# Patient Record
Sex: Female | Born: 1975 | Race: White | Hispanic: No | Smoking: Former smoker
Health system: Southern US, Community
[De-identification: ages and names within clinical notes are randomized; demographics above are authoritative.]

## PROBLEM LIST (undated history)

## (undated) DIAGNOSIS — I10 Essential (primary) hypertension: Secondary | ICD-10-CM

## (undated) DIAGNOSIS — Z789 Other specified health status: Secondary | ICD-10-CM

## (undated) DIAGNOSIS — F419 Anxiety disorder, unspecified: Secondary | ICD-10-CM

## (undated) DIAGNOSIS — G4700 Insomnia, unspecified: Secondary | ICD-10-CM

## (undated) DIAGNOSIS — K529 Noninfective gastroenteritis and colitis, unspecified: Secondary | ICD-10-CM

## (undated) DIAGNOSIS — D649 Anemia, unspecified: Secondary | ICD-10-CM

## (undated) DIAGNOSIS — M1990 Unspecified osteoarthritis, unspecified site: Secondary | ICD-10-CM

## (undated) DIAGNOSIS — K219 Gastro-esophageal reflux disease without esophagitis: Secondary | ICD-10-CM

## (undated) HISTORY — PX: TUBAL LIGATION: SHX77

## (undated) HISTORY — DX: Insomnia, unspecified: G47.00

## (undated) HISTORY — PX: ABDOMINAL SURGERY: SHX537

## (undated) HISTORY — DX: Noninfective gastroenteritis and colitis, unspecified: K52.9

## (undated) HISTORY — DX: Unspecified osteoarthritis, unspecified site: M19.90

## (undated) HISTORY — DX: Anemia, unspecified: D64.9

## (undated) HISTORY — DX: Gastro-esophageal reflux disease without esophagitis: K21.9

---

## 1999-10-01 ENCOUNTER — Emergency Department (HOSPITAL_COMMUNITY): Admission: EM | Admit: 1999-10-01 | Discharge: 1999-10-01 | Payer: Self-pay | Admitting: Emergency Medicine

## 2000-02-18 ENCOUNTER — Inpatient Hospital Stay (HOSPITAL_COMMUNITY): Admission: AD | Admit: 2000-02-18 | Discharge: 2000-02-18 | Payer: Self-pay | Admitting: Obstetrics

## 2000-08-17 ENCOUNTER — Inpatient Hospital Stay (HOSPITAL_COMMUNITY): Admission: AD | Admit: 2000-08-17 | Discharge: 2000-08-17 | Payer: Self-pay | Admitting: *Deleted

## 2000-09-14 ENCOUNTER — Inpatient Hospital Stay (HOSPITAL_COMMUNITY): Admission: AD | Admit: 2000-09-14 | Discharge: 2000-09-14 | Payer: Self-pay | Admitting: Obstetrics and Gynecology

## 2000-09-27 ENCOUNTER — Inpatient Hospital Stay (HOSPITAL_COMMUNITY): Admission: AD | Admit: 2000-09-27 | Discharge: 2000-09-27 | Payer: Self-pay | Admitting: Obstetrics and Gynecology

## 2000-10-03 ENCOUNTER — Inpatient Hospital Stay (HOSPITAL_COMMUNITY): Admission: AD | Admit: 2000-10-03 | Discharge: 2000-10-03 | Payer: Self-pay | Admitting: Obstetrics and Gynecology

## 2000-10-04 ENCOUNTER — Inpatient Hospital Stay (HOSPITAL_COMMUNITY): Admission: AD | Admit: 2000-10-04 | Discharge: 2000-10-06 | Payer: Self-pay | Admitting: Obstetrics & Gynecology

## 2001-03-23 ENCOUNTER — Emergency Department (HOSPITAL_COMMUNITY): Admission: EM | Admit: 2001-03-23 | Discharge: 2001-03-23 | Payer: Self-pay | Admitting: Emergency Medicine

## 2001-07-27 ENCOUNTER — Other Ambulatory Visit: Admission: RE | Admit: 2001-07-27 | Discharge: 2001-07-27 | Payer: Self-pay | Admitting: *Deleted

## 2001-07-27 ENCOUNTER — Other Ambulatory Visit: Admission: RE | Admit: 2001-07-27 | Discharge: 2001-07-27 | Payer: Self-pay | Admitting: Obstetrics and Gynecology

## 2001-10-12 ENCOUNTER — Encounter: Payer: Self-pay | Admitting: Obstetrics and Gynecology

## 2001-10-12 ENCOUNTER — Inpatient Hospital Stay (HOSPITAL_COMMUNITY): Admission: AD | Admit: 2001-10-12 | Discharge: 2001-10-12 | Payer: Self-pay | Admitting: Obstetrics and Gynecology

## 2001-12-05 ENCOUNTER — Inpatient Hospital Stay (HOSPITAL_COMMUNITY): Admission: AD | Admit: 2001-12-05 | Discharge: 2001-12-05 | Payer: Self-pay | Admitting: Obstetrics and Gynecology

## 2002-03-14 ENCOUNTER — Inpatient Hospital Stay (HOSPITAL_COMMUNITY): Admission: AD | Admit: 2002-03-14 | Discharge: 2002-03-14 | Payer: Self-pay | Admitting: Obstetrics and Gynecology

## 2002-03-17 ENCOUNTER — Inpatient Hospital Stay (HOSPITAL_COMMUNITY): Admission: AD | Admit: 2002-03-17 | Discharge: 2002-03-19 | Payer: Self-pay | Admitting: Obstetrics & Gynecology

## 2002-08-08 ENCOUNTER — Emergency Department (HOSPITAL_COMMUNITY): Admission: EM | Admit: 2002-08-08 | Discharge: 2002-08-08 | Payer: Self-pay

## 2002-08-09 ENCOUNTER — Emergency Department (HOSPITAL_COMMUNITY): Admission: EM | Admit: 2002-08-09 | Discharge: 2002-08-09 | Payer: Self-pay | Admitting: *Deleted

## 2002-08-10 ENCOUNTER — Emergency Department (HOSPITAL_COMMUNITY): Admission: EM | Admit: 2002-08-10 | Discharge: 2002-08-10 | Payer: Self-pay | Admitting: Emergency Medicine

## 2003-09-05 ENCOUNTER — Other Ambulatory Visit: Admission: RE | Admit: 2003-09-05 | Discharge: 2003-09-05 | Payer: Self-pay | Admitting: Obstetrics & Gynecology

## 2003-09-17 ENCOUNTER — Inpatient Hospital Stay (HOSPITAL_COMMUNITY): Admission: AD | Admit: 2003-09-17 | Discharge: 2003-09-17 | Payer: Self-pay | Admitting: Obstetrics and Gynecology

## 2004-02-20 ENCOUNTER — Inpatient Hospital Stay (HOSPITAL_COMMUNITY): Admission: AD | Admit: 2004-02-20 | Discharge: 2004-02-20 | Payer: Self-pay | Admitting: Obstetrics & Gynecology

## 2004-04-03 ENCOUNTER — Inpatient Hospital Stay (HOSPITAL_COMMUNITY): Admission: AD | Admit: 2004-04-03 | Discharge: 2004-04-03 | Payer: Self-pay | Admitting: Obstetrics and Gynecology

## 2004-04-15 ENCOUNTER — Inpatient Hospital Stay (HOSPITAL_COMMUNITY): Admission: AD | Admit: 2004-04-15 | Discharge: 2004-04-17 | Payer: Self-pay | Admitting: Obstetrics and Gynecology

## 2004-04-24 ENCOUNTER — Emergency Department (HOSPITAL_COMMUNITY): Admission: EM | Admit: 2004-04-24 | Discharge: 2004-04-24 | Payer: Self-pay | Admitting: Emergency Medicine

## 2004-04-25 ENCOUNTER — Emergency Department (HOSPITAL_COMMUNITY): Admission: EM | Admit: 2004-04-25 | Discharge: 2004-04-25 | Payer: Self-pay | Admitting: Emergency Medicine

## 2004-05-14 ENCOUNTER — Emergency Department (HOSPITAL_COMMUNITY): Admission: EM | Admit: 2004-05-14 | Discharge: 2004-05-14 | Payer: Self-pay

## 2004-10-17 ENCOUNTER — Emergency Department (HOSPITAL_COMMUNITY): Admission: EM | Admit: 2004-10-17 | Discharge: 2004-10-17 | Payer: Self-pay | Admitting: Emergency Medicine

## 2004-10-25 ENCOUNTER — Emergency Department (HOSPITAL_COMMUNITY): Admission: EM | Admit: 2004-10-25 | Discharge: 2004-10-25 | Payer: Self-pay | Admitting: Family Medicine

## 2004-11-12 ENCOUNTER — Ambulatory Visit: Payer: Self-pay | Admitting: Family Medicine

## 2004-11-25 ENCOUNTER — Emergency Department (HOSPITAL_COMMUNITY): Admission: EM | Admit: 2004-11-25 | Discharge: 2004-11-26 | Payer: Self-pay | Admitting: Emergency Medicine

## 2004-11-25 ENCOUNTER — Ambulatory Visit: Payer: Self-pay | Admitting: Family Medicine

## 2004-12-16 ENCOUNTER — Ambulatory Visit: Payer: Self-pay | Admitting: Family Medicine

## 2005-06-29 ENCOUNTER — Emergency Department (HOSPITAL_COMMUNITY): Admission: EM | Admit: 2005-06-29 | Discharge: 2005-06-29 | Payer: Self-pay | Admitting: Emergency Medicine

## 2005-12-07 ENCOUNTER — Emergency Department: Payer: Self-pay | Admitting: Emergency Medicine

## 2006-01-08 ENCOUNTER — Emergency Department: Payer: Self-pay | Admitting: Emergency Medicine

## 2006-03-05 ENCOUNTER — Emergency Department: Payer: Self-pay | Admitting: Emergency Medicine

## 2006-04-10 ENCOUNTER — Emergency Department: Payer: Self-pay | Admitting: Emergency Medicine

## 2006-04-11 ENCOUNTER — Emergency Department (HOSPITAL_COMMUNITY): Admission: EM | Admit: 2006-04-11 | Discharge: 2006-04-12 | Payer: Self-pay | Admitting: Emergency Medicine

## 2006-05-09 ENCOUNTER — Emergency Department: Payer: Self-pay | Admitting: Emergency Medicine

## 2006-10-05 ENCOUNTER — Emergency Department: Payer: Self-pay | Admitting: Emergency Medicine

## 2006-10-26 ENCOUNTER — Emergency Department: Payer: Self-pay | Admitting: Emergency Medicine

## 2006-11-30 ENCOUNTER — Ambulatory Visit: Payer: Self-pay | Admitting: Family Medicine

## 2007-01-19 ENCOUNTER — Emergency Department: Payer: Self-pay | Admitting: Emergency Medicine

## 2007-02-11 ENCOUNTER — Emergency Department: Payer: Self-pay | Admitting: Emergency Medicine

## 2007-04-04 ENCOUNTER — Emergency Department: Payer: Self-pay | Admitting: Emergency Medicine

## 2007-05-05 ENCOUNTER — Encounter: Admission: RE | Admit: 2007-05-05 | Discharge: 2007-05-05 | Payer: Self-pay | Admitting: Obstetrics and Gynecology

## 2007-05-12 ENCOUNTER — Encounter (INDEPENDENT_AMBULATORY_CARE_PROVIDER_SITE_OTHER): Payer: Self-pay | Admitting: Obstetrics and Gynecology

## 2007-05-12 ENCOUNTER — Ambulatory Visit (HOSPITAL_COMMUNITY): Admission: RE | Admit: 2007-05-12 | Discharge: 2007-05-12 | Payer: Self-pay | Admitting: Obstetrics and Gynecology

## 2007-11-18 ENCOUNTER — Emergency Department: Payer: Self-pay | Admitting: Emergency Medicine

## 2008-01-17 ENCOUNTER — Emergency Department (HOSPITAL_COMMUNITY): Admission: EM | Admit: 2008-01-17 | Discharge: 2008-01-18 | Payer: Self-pay | Admitting: Emergency Medicine

## 2008-01-26 ENCOUNTER — Emergency Department: Payer: Self-pay | Admitting: Emergency Medicine

## 2008-06-07 ENCOUNTER — Ambulatory Visit (HOSPITAL_COMMUNITY): Admission: RE | Admit: 2008-06-07 | Discharge: 2008-06-07 | Payer: Self-pay | Admitting: Obstetrics and Gynecology

## 2008-08-13 ENCOUNTER — Encounter (INDEPENDENT_AMBULATORY_CARE_PROVIDER_SITE_OTHER): Payer: Self-pay | Admitting: Surgery

## 2008-08-13 ENCOUNTER — Ambulatory Visit (HOSPITAL_COMMUNITY): Admission: RE | Admit: 2008-08-13 | Discharge: 2008-08-14 | Payer: Self-pay | Admitting: Surgery

## 2009-01-23 ENCOUNTER — Emergency Department: Payer: Self-pay | Admitting: Internal Medicine

## 2009-09-29 ENCOUNTER — Emergency Department: Payer: Self-pay | Admitting: Emergency Medicine

## 2010-03-09 ENCOUNTER — Emergency Department: Payer: Self-pay | Admitting: Emergency Medicine

## 2010-04-10 ENCOUNTER — Emergency Department (HOSPITAL_COMMUNITY): Admission: EM | Admit: 2010-04-10 | Discharge: 2010-04-10 | Payer: Self-pay | Admitting: Emergency Medicine

## 2010-09-25 ENCOUNTER — Emergency Department: Payer: Self-pay | Admitting: Emergency Medicine

## 2011-03-11 ENCOUNTER — Emergency Department: Payer: Self-pay | Admitting: Emergency Medicine

## 2011-04-28 NOTE — Op Note (Signed)
NAMENIMRAT, WOOLWORTH                ACCOUNT NO.:  [id number]   MEDICAL RECORD NO.:  [id number]          PATIENT TYPE:  AMB   LOCATION:  SDC                           FACILITY:  [facility]   PHYSICIAN:  Malva Limes, M.D.    DATE OF BIRTH:  [birth year]   DATE OF PROCEDURE:  06/07/2008  DATE OF DISCHARGE:                               OPERATIVE REPORT   PREOPERATIVE DIAGNOSIS:  Chronic abdominal and pelvic pain.   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   SURGEON:  Malva Limes, MD   ANESTHESIA:  General antibiotic, Ancef 1 g.   DRAINS:  Rubber catheter bladder.   SPECIMENS:  None.   COMPLICATIONS:  None.   ESTIMATED BLOOD LOSS:  Minimal.   FINDINGS:  The patient had no evidence of any pelvic adhesions or  endometriosis.  The patient had evidence of past tubal ligation.  The  patient had no fibroids.  The patient had small cyst on the left ovary.  The right ovary appeared to be normal.  Left ovary appeared to have  follicular cyst.  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   PROCEDURE:  The patient was taken to the operating room where she was  placed in dorsal supine position and general anesthetic was administered  without complications.  She was then placed in dorsal lithotomy  position.  She was prepped and draped in the usual fashion for this  procedure.  Her bladder was drained with a rubber catheter.  Hulka  tenaculum applied to the anterior cervical lip.  Her umbilicus was then  injected with 0.25% Marcaine.  A vertical skin incision was made.  The  fascia was grasped and entered sharply.  The parietal peritoneum was  entered sharply, 0 Vicryl suture was placed in a purse-string fashion.  Hasson cannula was placed in the bare end to the abdominal cavity and 3  liters of carbon dioxide insufflated.  The patient was then placed in  Trendelenburg.  A  5-mm port was placed in the suprapubic region under  direct visualization.  At this point an exam of the abdominal and pelvic  contents was undertaken with findings as noted above.  During the exam,  the patient was found to have a small irregular growth on a loop of  small bowel.  On further examination, this appeared to be a large Meckel  diverticulum.  Of note, the patient also had normal-appearing liver and  gallbladder.  Because there was no evidence of any pelvic pathology,  adhesions, or endometriosis, the procedure was concluded at this point.  The patient will have resection of this performed by a general surgeon.  At this point, the instruments and pneumoperitoneum were  released.  The fascia closed with 0 Vicryl suture, the skin with 3-0  Vicryl suture, and Dermabond.  The patient was extubated and taken to  recovery room in stable condition.  Instrument and lap count was correct  x1.  The patient will be discharged home with Percocet  to take p.r.n.  She will follow up with Dr. Michaell Cowing.           ______________________________  Malva Limes, M.D.     MA/MEDQ  D:  06/07/2008  T:  06/08/2008  Job:  [id number]   cc:   Ardeth Sportsman, MD  [address] [place] [address]

## 2011-04-28 NOTE — Op Note (Signed)
Candice Le, Candice Le                ACCOUNT NO.:  [id number]   MEDICAL RECORD NO.:  [id number]          PATIENT TYPE:  AMB   LOCATION:  SDC                           FACILITY:  [facility]   PHYSICIAN:  Malva Limes, M.D.    DATE OF BIRTH:  [birth year]   DATE OF PROCEDURE:  05/12/2007  DATE OF DISCHARGE:                               OPERATIVE REPORT   PREOPERATIVE DIAGNOSES:  1. Intrauterine pregnancy at 7 weeks' estimated gestational age.  2. Patient desires termination of pregnancy.  3. Patient desires permanent sterilization.   POSTOPERATIVE DIAGNOSES:  1. Intrauterine pregnancy at 7 weeks' estimated gestational age.  2. Patient desires termination of pregnancy.  3. Patient desires permanent sterilization.   PROCEDURES:  1. Dilation and evacuation.  2. Laparoscopic tubal ligation with Filshie clips.   SURGEON:  Malva Limes, M.D.   ANESTHESIA:  General endotracheal.   ANTIBIOTICS:  Ancef 1 g.   DRAINS:  Catheter to bladder.   SPECIMENS:  Products of conception sent to pathology.   COMPLICATIONS:  The patient had area of bleeding in the mesosalpinx  which was treated with cautery and Gelfoam.   PROCEDURE:  The patient was taken to the operating room, where she was  placed in the dorsal supine position and a general anesthetic was  administered without complications.  She was then placed in the dorsal  lithotomy position and prepped with Betadine.  Her bladder was drained  with a catheter.  She was then draped in the usual fashion for this  procedure.  A sterile speculum was placed in the vagina.  Lidocaine 1%  10 mL was used for a paracervical block.  The cervix was serially  dilated to a #29 [place].  An 8 mm suction cannula was placed into the  intrauterine cavity and products of conception withdrawn.  Sharp  curettage was then performed, followed by repeat suction.  This  concluded the dilation and evacuation.  At this point the patient was  redraped, gowns were changed.   The umbilicus was then injected with  0.25% Marcaine.  A vertical skin incision was made.  The fascia was  grasped with a Kocher and entered with a Surveyor, quantity.  The parietal  peritoneum was entered sharply.  A 0 Vicryl suture was placed in a  pursestring fashion.  The Hasson cannula was placed into the peritoneal  cavity.  Carbon dioxide 3 L was then insufflated.  The patient then  placed in Trendelenburg.  On examination the patient had no evidence of  endometriosis or adhesions.  A Filshie clip applicator was then placed  in the abdominal cavity and Filshie clip placed in the isthmic portion  of the left fallopian tube.  This clip was applied perpendicular to the  tube.  The entire tube appeared to be within the clasp.  Next a Filshie  clip was placed on the right fallopian tube; however, when this was done  it was felt that the ovarian ligament may be included in the clip.  An  attempt was made to pull the clip back off of this; however, when  this  was accomplished bleeding was noted.  The Kleppinger forceps was used to  try and cauterize this area without success.  A second Filshie clip was  applied medial to the first clip in an attempt to tamponade the bleeding  vessel.  This helped somewhat but the patient continued to ooze.  Again  cautery was used followed by placement of Gelfoam.  After the Gelfoam  was placed the patient had no further bleeding.  The pressure of the  abdominal cavity was released and again no bleeding was noted.  At this  point the instruments were removed, pneumoperitoneum released.  The  fascia was closed with the 0 Vicryl suture, the skin with 4-0 Vicryl  suture in interrupted fashion.  Steri-Strips were placed in the 5 mm  port in the suprapubic region.  This concluded the procedure.  The  patient was taken to the recovery room in stable condition.  She will be  discharged to home.  She will be sent home with Percocet to take p.r.n.  She will follow up  in the office in the morning.  She will be instructed  to call if any abdominal pain or abnormal symptoms.           ______________________________  Malva Limes, M.D.     MA/MEDQ  D:  05/12/2007  T:  05/12/2007  Job:  [id number]

## 2011-04-28 NOTE — Op Note (Signed)
NAMEESSYNCE, MUNSCH NO.:  [id number]   MEDICAL RECORD NO.:  [id number]          PATIENT TYPE:  OIB   LOCATION:  5120                         FACILITY:  [facility]   PHYSICIAN:  Ardeth Sportsman, MD     DATE OF BIRTH:  [birth year]   DATE OF PROCEDURE:  DATE OF DISCHARGE:                               OPERATIVE REPORT   GYNECOLOGIST:  Malva Limes, M.D., [facility].   SURGEON:  Ardeth Sportsman, MD   ASSISTANT:  Troy Sine. Dwain Sarna, MD   PREOPERATIVE DIAGNOSES:  1. Intermittent right lower quadrant abdominal pain.  2. Question chronic appendicitis.  3. Question intermittent Meckel diverticulitis.   POSTOPERATIVE DIAGNOSES:  1. Intermittent right lower quadrant abdominal pain.  2. Question chronic appendicitis.  3. Question intermittent Meckel diverticulitis.   PROCEDURE PERFORMED:  1. Diagnostic laparoscopy.  2. Laparoscopic Meckel diverticulectomy.  3. Laparoscopic appendectomy.   SPECIMENS:  1. Meckel diverticulum.  2. Appendix   DRAINS:  None.   ESTIMATED BLOOD LOSS:  Less than 10 mL.   COMPLICATIONS:  None apparent.   INDICATIONS:  Ms. Maurine Minister is a 35 year old female who has intermittent  abdominal pain that have been going on for numerous years.  I was  wondering if it was related to a gynecological etiology, so she had a  diagnostic laparoscopy.  She had small ovarian cysts and uterus seemed  fine.  She did have a wide mouth Meckel diverticulum, some abnormal  forms.  Surgical consultation was requested.  I saw the patient and  offered to do Meckel diverticulectomy and appendectomy given her  intermittent right lower quadrant pain and performed diagnostic  laparoscopy.  Technique of procedure was discussed.  Risks, benefits,  and alternatives were discussed.  Questions were answered and she agreed  to proceed.   OPERATIVE FINDINGS:  Her appendix appeared to be normal and it was  retrocecal.  She had a short right  ascending colon with her cecum near  her upper midabdomen, she did have a wide mouth Meckel diverticulum with  a diameter of about the size of the small bowel that was about 7-cm  long.  The distal tip had numerous small nodules on it.  There was no  evidence of any other abnormalities or intra-abdominal adhesions.  She  was status post tubal ligation.  She had no liver masses or other  abnormalities.  There is no evidence of any hernias.   DESCRIPTION OF PROCEDURE:  Informed consent was affirmed.  The patient  received IV antibiotics just prior to surgery.  She had sequential  compression devices anchored throughout the entire case.  She underwent  general anesthesia without any difficulty.  She had a Foley catheter  sterilely placed.  She was in supine with arms tucked.  Her abdomen was  prepped and draped in a sterile fashion.   A 5-mm port was placed in right upper quadrant using optical entry  technique with the patient's deep reversed in Trendelenburg and right  side up.  A camera inspection revealed no intra-abdominal injury.  Under  direct visualization, a  5-mm port was placed through the umbilicus and  12-mm port was placed in the suprapubic region just left of midline.   A camera inspection, diagnostic laparoscopy is formed.  Her liver and  gallbladder appeared normal.  Her stomach and greater omentum appeared  normal.  There was no obvious intra-abdominal adhesions.  Her colon  appeared normal as well.   We could see an obvious Meckel diverticulum in her right mid abdomen.  It was as described above.  I went ahead and mobilized cecum in a  lateral to medial fashion, could better see the appendix.  I went ahead  and took the appendiceal mesentery using ultrasonic dissection down to  its base.  Hemostasis is excellent.   I went ahead and removed the Meckel diverticulum using a laparoscopic  stapler and it was done in a stapling off with one stapling.  I took it  in mostly  transverse but somewhat oblique fashion.  The appendix was  stapled off taking a normal cuff of cecum as well.  Hemostasis was  excellent.  Both Meckel diverticulum and appendix were placed inside the  EndoCatch bag and removed the subxiphoid port.   Staple lines were inspected and they were intact, viable with no  bleeding.  The small bowel was run from the cecum all the way to the  ligament of Treitz and there were no other abnormalities, no intraloop  adhesions.  A diagnostic laparoscopy revealed normal ovaries with no  significant cyst.  She was status post tubal ligation.  Her uterus  appeared normal.  I could easily see her internal rings and there was no  evidence of any inguinal hernia or femoral hernia or any other  abnormality.  The bladder appeared to be normal.  The omentum appeared  to be normal.  There was no umbilical hernia.   We inspected the staple lines again.  They were intact and viable.  Given that there was no other abnormalities we decided to complete the  case.  Capnoperitoneum was evacuated and ports removed.  The suprapubic  fascial defect was just barely allowing my pinkie to go through and I  then closed it primarily using a 0-Vicryl stitch.  It had been tunneled  in at an angle on purpose.  It stayed well right from the bladder.  Skin  was closed using 4-0 Monocryl stitch.  Sterile dressing was applied.  The patient was extubated and taken to recovery room in stable  condition.   Per her request, I am about to discuss the operative findings with her  significant other.  We will observe her overnight given the fact it was  a wide-mouth diverticulum and make sure she is stable.  If she does very  well, she can leave maybe as soon as tonight, but we will probably watch  her at least overnight.      Ardeth Sportsman, MD  Electronically Signed     SCG/MEDQ  D:  08/13/2008  T:  08/14/2008  Job:  [id number]   cc:   Malva Limes, M.D.

## 2011-05-01 NOTE — Discharge Summary (Signed)
[facility]  Patient:    Candice Le, Candice Le                         MRN: [id number] Adm. Date:  [id number] Disc. Date: [id number] Attending:  Lars Pinks Dictator:   Leilani Able, P.A.                           Discharge Summary  FINAL DIAGNOSIS:              Vacuum-assisted vaginal delivery of a female infant with Apgars of 9 and 9.  HISTORY OF PRESENT ILLNESS:   This 35 year old G71, P1, presents at 40-1/[redacted] weeks gestation in early labor.  The patients prenatal course has been uncomplicated.  She is admitted at this time for delivery.  HOSPITAL COURSE:              The patient dilated to complete.  She pushed for about two hours, with vertex still at a +3 station.  At this point the ______ vacuum was applied to assist with delivery.  The patient had the delivery of a 10-pound 1-ounce female infant with Apgars of 9 and 9, over first-degree bilateral labial lacerations.  These were repaired.  The delivery went without complication.  The patients postpartum course was benign, without significant fevers.  She was felt ready for discharge on postpartum day #2.  Was sent home on a regular diet, told to decrease activities, told to continue prenatal vitamins.  Given Tylenol #30 1 every four hours as needed for pain, and told to follow up in the office in four to six weeks. DD:  11/03/00 TD:  11/04/00 Job: [id number]

## 2011-07-07 ENCOUNTER — Emergency Department: Payer: Self-pay | Admitting: Emergency Medicine

## 2011-09-04 LAB — URINALYSIS, ROUTINE W REFLEX MICROSCOPIC
Bilirubin Urine: NEGATIVE
Leukocytes, UA: NEGATIVE
Nitrite: NEGATIVE
Protein, ur: NEGATIVE
Specific Gravity, Urine: 1.006
pH: 6.5

## 2011-09-04 LAB — COMPREHENSIVE METABOLIC PANEL
AST: 23
Albumin: 4
BUN: 7
Creatinine, Ser: 0.71
GFR calc non Af Amer: >60
Glucose, Bld: 91
Potassium: 3.4 — ABNORMAL LOW
Total Bilirubin: 0.7

## 2011-09-04 LAB — DIFFERENTIAL
Basophils Absolute: 0
Basophils Relative: 1
Eosinophils Absolute: 0.1
Lymphs Abs: 1
Monocytes Absolute: 0.4
Monocytes Relative: 14 — ABNORMAL HIGH
Neutro Abs: 1.5 — ABNORMAL LOW
Neutrophils Relative %: 50

## 2011-09-04 LAB — CBC
HCT: 37.6
Hemoglobin: 13
MCHC: 34.7
Platelets: 173
RDW: 11.6
WBC: 3 — ABNORMAL LOW

## 2011-09-04 LAB — URINE MICROSCOPIC-ADD ON

## 2011-09-04 LAB — POCT PREGNANCY, URINE: Preg Test, Ur: NEGATIVE

## 2011-09-10 LAB — CBC
Platelets: 226
RBC: 4.25

## 2011-09-10 LAB — PREGNANCY, URINE: Preg Test, Ur: NEGATIVE

## 2012-05-28 ENCOUNTER — Encounter (HOSPITAL_COMMUNITY): Payer: Self-pay

## 2012-05-28 ENCOUNTER — Emergency Department (HOSPITAL_COMMUNITY)
Admission: EM | Admit: 2012-05-28 | Discharge: 2012-05-28 | Disposition: A | Discharge status: Home / Self Care | Payer: Self-pay | Attending: Emergency Medicine | Admitting: Emergency Medicine

## 2012-05-28 DIAGNOSIS — IMO0002 Reserved for concepts with insufficient information to code with codable children: Secondary | ICD-10-CM | POA: Insufficient documentation

## 2012-05-28 DIAGNOSIS — W57XXXA Bitten or stung by nonvenomous insect and other nonvenomous arthropods, initial encounter: Secondary | ICD-10-CM

## 2012-05-28 DIAGNOSIS — F17200 Nicotine dependence, unspecified, uncomplicated: Secondary | ICD-10-CM | POA: Insufficient documentation

## 2012-05-28 MED ORDER — CEPHALEXIN 250 MG PO CAPS: 250.0000 mg | ORAL_CAPSULE | Freq: Four times a day (QID) | ORAL | Status: AC

## 2012-05-28 NOTE — ED Notes (Signed)
Pt in from home with possible right lateral ankle states noted 2 weeks ago states itching increased throughout the days

## 2012-05-28 NOTE — Discharge Instructions (Signed)
Candice Le start the antibiotics asap.  Use calamine lotion to help dry the area out.  Return if worse.  Follup with a pcp from the list below.

## 2012-05-28 NOTE — ED Notes (Signed)
Pt presents with possible insect bite to right ankle that occurred 1-2 weeks ago. Pt states over last few days site has become more red and irritable. Pt c/o burning pain at site.

## 2012-06-01 NOTE — ED Provider Notes (Signed)
History     CSN: [id number]  Arrival date & time 05/28/12  1900   First MD Initiated Contact with Patient 05/28/12 1959      Chief Complaint  Patient presents with  . Insect Bite    (Consider location/radiation/quality/duration/timing/severity/associated sxs/prior treatment) The history is provided by the patient. No language interpreter was used.  cc; Bug bite to R lateral ankle. With erythema. Sulfa allergy. No fever, n/v or neck pain.   History reviewed. No pertinent past medical history.  Past Surgical History  Procedure Date  . Abdominal surgery     No family history on file.  History  Substance Use Topics  . Smoking status: Current Everyday Smoker  . Smokeless tobacco: Not on file  . Alcohol Use: No    OB History    Grav Para Term Preterm Abortions TAB SAB Ect Mult Living                  Review of Systems  Constitutional: Negative.   HENT: Negative.   Eyes: Negative.   Respiratory: Negative.   Cardiovascular: Negative.   Gastrointestinal: Negative.   Skin:       R ankle bug bite tenderness/burning.  Neurological: Negative.   Psychiatric/Behavioral: Negative.   All other systems reviewed and are negative.    Allergies  Sulfa antibiotics  Home Medications   Current Outpatient Rx  Name Route Sig Dispense Refill  . CEPHALEXIN 250 MG PO CAPS Oral Take 1 capsule (250 mg total) by mouth 4 (four) times daily. 28 capsule 0    BP 102/62  Pulse 72  Temp 99 F (37.2 C) (Oral)  Resp 18  SpO2 99%  LMP 05/02/2012  Physical Exam  Nursing note and vitals reviewed. Constitutional: She is oriented to person, place, and time. She appears well-developed and well-nourished.  HENT:  Head: Normocephalic and atraumatic.  Eyes: Conjunctivae and EOM are normal. Pupils are equal, round, and reactive to light.  Neck: Normal range of motion. Neck supple.  Cardiovascular: Normal rate.   Pulmonary/Chest: Effort normal.  Abdominal: Soft.  Musculoskeletal:  Normal range of motion. She exhibits no edema and no tenderness.  Neurological: She is alert and oriented to person, place, and time. She has normal reflexes.  Skin: Skin is warm and dry.       R ankle bug bite erythema no cellulitis  Psychiatric: She has a normal mood and affect.    ED Course  Procedures (including critical care time)  Labs Reviewed - No data to display No results found.   1. Bug bite       MDM  R ankle bug bite with erythema no cellulitis.  Keflex rx.  Ibuprofen for pain.  Calamine lotion to site. No fever,n/v or neck pain.        Remi Haggard, NP 06/01/12 1335

## 2012-06-09 NOTE — ED Provider Notes (Signed)
Medical screening examination/treatment/procedure(s) were performed by non-physician practitioner and as supervising physician I was immediately available for consultation/collaboration.  Toy Baker, MD 06/09/12 1743

## 2012-07-13 ENCOUNTER — Encounter (HOSPITAL_COMMUNITY): Payer: Self-pay | Admitting: *Deleted

## 2012-07-13 ENCOUNTER — Emergency Department (HOSPITAL_COMMUNITY)
Admission: EM | Admit: 2012-07-13 | Discharge: 2012-07-13 | Disposition: A | Discharge status: Home / Self Care | Payer: Self-pay | Attending: Emergency Medicine | Admitting: Emergency Medicine

## 2012-07-13 ENCOUNTER — Emergency Department (HOSPITAL_COMMUNITY): Payer: Self-pay

## 2012-07-13 DIAGNOSIS — R51 Headache: Secondary | ICD-10-CM | POA: Insufficient documentation

## 2012-07-13 DIAGNOSIS — F17200 Nicotine dependence, unspecified, uncomplicated: Secondary | ICD-10-CM | POA: Insufficient documentation

## 2012-07-13 LAB — URINE MICROSCOPIC-ADD ON

## 2012-07-13 LAB — URINALYSIS, ROUTINE W REFLEX MICROSCOPIC
Bilirubin Urine: NEGATIVE
Glucose, UA: NEGATIVE mg/dL
Specific Gravity, Urine: 1.005 (ref 1.005–1.030)
pH: 7.5 (ref 5.0–8.0)

## 2012-07-13 LAB — CBC WITH DIFFERENTIAL/PLATELET
Basophils Relative: 1 % (ref 0–1)
Eosinophils Relative: 1 % (ref 0–5)
HCT: 40.7 % (ref 36.0–46.0)
Hemoglobin: 14.2 g/dL (ref 12.0–15.0)
Lymphocytes Relative: 27 % (ref 12–46)
Lymphs Abs: 2.4 10*3/uL (ref 0.7–4.0)
MCH: 32.6 pg (ref 26.0–34.0)
MCHC: 34.9 g/dL (ref 30.0–36.0)
MCV: 93.6 fL (ref 78.0–100.0)
Neutro Abs: 5.9 10*3/uL (ref 1.7–7.7)

## 2012-07-13 LAB — POCT I-STAT, CHEM 8
Calcium, Ion: 1.19 mmol/L (ref 1.12–1.23)
Chloride: 104 mEq/L (ref 96–112)
Creatinine, Ser: 0.8 mg/dL (ref 0.50–1.10)
HCT: 43 % (ref 36.0–46.0)

## 2012-07-13 LAB — POCT PREGNANCY, URINE: Preg Test, Ur: NEGATIVE

## 2012-07-13 MED ORDER — SODIUM CHLORIDE 0.9 % IV BOLUS (SEPSIS)
500.0000 mL | Freq: Once | INTRAVENOUS | Status: AC
  Administered 2012-07-13: 500 mL via INTRAVENOUS

## 2012-07-13 MED ORDER — DIAZEPAM 5 MG PO TABS
5.0000 mg | ORAL_TABLET | Freq: Once | ORAL | Status: AC
  Administered 2012-07-13: 5 mg via ORAL
  Filled 2012-07-13: qty 1

## 2012-07-13 MED ORDER — OXYCODONE-ACETAMINOPHEN 5-325 MG PO TABS: 1.0000 | ORAL_TABLET | Freq: Four times a day (QID) | ORAL | Status: AC | PRN

## 2012-07-13 MED ORDER — PROMETHAZINE HCL 25 MG/ML IJ SOLN
25.0000 mg | Freq: Once | INTRAMUSCULAR | Status: AC
  Administered 2012-07-13: 25 mg via INTRAVENOUS
  Filled 2012-07-13: qty 1

## 2012-07-13 MED ORDER — KETOROLAC TROMETHAMINE 30 MG/ML IJ SOLN
30.0000 mg | Freq: Once | INTRAMUSCULAR | Status: AC
  Administered 2012-07-13: 30 mg via INTRAVENOUS
  Filled 2012-07-13: qty 1

## 2012-07-13 MED ORDER — DIPHENHYDRAMINE HCL 50 MG/ML IJ SOLN
25.0000 mg | Freq: Once | INTRAMUSCULAR | Status: AC
  Administered 2012-07-13: 25 mg via INTRAVENOUS
  Filled 2012-07-13: qty 1

## 2012-07-13 NOTE — ED Notes (Signed)
To ED for eval of ongoing HA for the past cple days. No photophobia, no trauma, no vomiting. Pt is aaox4. Ambulatory. OTC meds taken without relief

## 2012-07-13 NOTE — ED Provider Notes (Addendum)
History     CSN: [id number]  Arrival date & time 07/13/12  0915   First MD Initiated Contact with Patient 07/13/12 0932      Chief Complaint  Patient presents with  . Headache    (Consider location/radiation/quality/duration/timing/severity/associated sxs/prior treatment) Patient is a 36 y.o. female presenting with headaches. The history is provided by the patient.  Headache  This is a new problem. Pertinent negatives include no shortness of breath, no nausea and no vomiting.   patient has had headaches over the last few days. It has been constant. It is throbbing and involves her whole head. She does have neck pain with it. No fevers. No nausea vomiting diarrhea. She states she's had headaches on and off like this for a while. No trauma. No numbness or weakness. No photophobia. No chest pain.  History reviewed. No pertinent past medical history.  Past Surgical History  Procedure Date  . Abdominal surgery   . Tubal ligation     History reviewed. No pertinent family history.  History  Substance Use Topics  . Smoking status: Current Everyday Smoker  . Smokeless tobacco: Not on file  . Alcohol Use: No    OB History    Grav Para Term Preterm Abortions TAB SAB Ect Mult Living                  Review of Systems  Constitutional: Negative for activity change and appetite change.  HENT: Negative for neck stiffness.   Eyes: Negative for pain.  Respiratory: Negative for chest tightness and shortness of breath.   Cardiovascular: Negative for chest pain and leg swelling.  Gastrointestinal: Negative for nausea, vomiting, abdominal pain and diarrhea.  Genitourinary: Negative for flank pain.  Musculoskeletal: Negative for back pain.  Skin: Negative for rash.  Neurological: Positive for headaches. Negative for weakness and numbness.  Psychiatric/Behavioral: Negative for behavioral problems.    Allergies  Sulfa antibiotics  Home Medications   Current Outpatient Rx  Name  Route Sig Dispense Refill  . GOODY HEADACHE PO Oral Take 1 packet by mouth daily as needed. Headache.    . IBUPROFEN 800 MG PO TABS Oral Take 800 mg by mouth every 8 (eight) hours as needed. As needed for headache.    . OXYCODONE-ACETAMINOPHEN 5-325 MG PO TABS Oral Take 1-2 tablets by mouth every 6 (six) hours as needed for pain. 6 tablet 0    BP 103/60  Pulse 60  Temp 99.3 F (37.4 C) (Oral)  Resp 18  SpO2 97%  LMP 07/03/2012  Physical Exam  Nursing note and vitals reviewed. Constitutional: She is oriented to person, place, and time. She appears well-developed and well-nourished.  HENT:  Head: Normocephalic and atraumatic.  Eyes: EOM are normal. Pupils are equal, round, and reactive to light.  Neck: Normal range of motion. Neck supple.       No meningeal signs  Cardiovascular: Normal rate, regular rhythm and normal heart sounds.   No murmur heard. Pulmonary/Chest: Effort normal and breath sounds normal. No respiratory distress. She has no wheezes. She has no rales.  Abdominal: Soft. Bowel sounds are normal. She exhibits no distension. There is no tenderness. There is no rebound and no guarding.  Musculoskeletal: Normal range of motion.  Neurological: She is alert and oriented to person, place, and time. No cranial nerve deficit.  Skin: Skin is warm and dry.  Psychiatric: She has a normal mood and affect. Her speech is normal.    ED Course  Procedures (including  critical care time)  Labs Reviewed  URINALYSIS, ROUTINE W REFLEX MICROSCOPIC - Abnormal; Notable for the following:    Hgb urine dipstick TRACE (*)     All other components within normal limits  POCT I-STAT, CHEM 8 - Abnormal; Notable for the following:    Glucose, Bld 108 (*)     All other components within normal limits  URINE MICROSCOPIC-ADD ON - Abnormal; Notable for the following:    Squamous Epithelial / LPF MANY (*)     Bacteria, UA FEW (*)     All other components within normal limits  CBC WITH  DIFFERENTIAL  POCT PREGNANCY, URINE   Ct Head Wo Contrast  07/13/2012  *RADIOLOGY REPORT*  Clinical Data:  Headache  CT HEAD WITHOUT CONTRAST  Technique:  Contiguous axial images were obtained from the base of the skull through the vertex without contrast  Comparison:  01/17/2008  Findings:  The brain has a normal appearance without evidence for hemorrhage, acute infarction, hydrocephalus, or mass lesion.  There is no extra axial fluid collection.  The skull and paranasal sinuses are normal.  IMPRESSION: Normal CT of the head without contrast.  Original Report Authenticated By: Camelia Phenes, M.D.     1. Headache       MDM  Patient with headache. And on for last couple days. No photophobia. No fevers. Doubt meningitis. Patient has continued pain after some treatment and head CT was done and was negative. Patient began to feel better and was discharged home.        Juliet Rude. Rubin Payor, MD 07/13/12 1407  Juliet Rude. Rubin Payor, MD 08/09/12 1501

## 2012-08-25 ENCOUNTER — Emergency Department: Payer: Self-pay | Admitting: Unknown Physician Specialty

## 2012-10-09 ENCOUNTER — Emergency Department: Payer: Self-pay | Admitting: Emergency Medicine

## 2013-02-06 ENCOUNTER — Other Ambulatory Visit: Payer: Self-pay | Admitting: Obstetrics and Gynecology

## 2014-12-26 ENCOUNTER — Encounter (HOSPITAL_COMMUNITY): Payer: Self-pay

## 2014-12-26 ENCOUNTER — Emergency Department (HOSPITAL_COMMUNITY)
Admission: EM | Admit: 2014-12-26 | Discharge: 2014-12-26 | Disposition: A | Discharge status: Home / Self Care | Payer: Self-pay | Attending: Emergency Medicine | Admitting: Emergency Medicine

## 2014-12-26 DIAGNOSIS — Z720 Tobacco use: Secondary | ICD-10-CM | POA: Insufficient documentation

## 2014-12-26 DIAGNOSIS — N390 Urinary tract infection, site not specified: Secondary | ICD-10-CM | POA: Insufficient documentation

## 2014-12-26 LAB — URINALYSIS, ROUTINE W REFLEX MICROSCOPIC
BILIRUBIN URINE: NEGATIVE
GLUCOSE, UA: NEGATIVE mg/dL
Ketones, ur: NEGATIVE mg/dL
Nitrite: NEGATIVE
Protein, ur: NEGATIVE mg/dL
SPECIFIC GRAVITY, URINE: 1.012 (ref 1.005–1.030)
UROBILINOGEN UA: 0.2 mg/dL (ref 0.0–1.0)
pH: 6 (ref 5.0–8.0)

## 2014-12-26 LAB — URINE MICROSCOPIC-ADD ON

## 2014-12-26 MED ORDER — OXYBUTYNIN CHLORIDE ER 5 MG PO TB24: 5.0000 mg | ORAL_TABLET | Freq: Every day | ORAL | Status: DC

## 2014-12-26 MED ORDER — NITROFURANTOIN MONOHYD MACRO 100 MG PO CAPS: 100.0000 mg | ORAL_CAPSULE | Freq: Two times a day (BID) | ORAL | Status: DC

## 2014-12-26 NOTE — Discharge Instructions (Signed)
Take Macrobid twice a day for 5-7 days. Return to the ER if he develops any severe pain, abdominal pain, nausea, vomiting, high fever, back pain. Follow-up with primary care or refer to resource guide below.   Urinary Tract Infection Urinary tract infections (UTIs) can develop anywhere along your urinary tract. Your urinary tract is your body's drainage system for removing wastes and extra water. Your urinary tract includes two kidneys, two ureters, a bladder, and a urethra. Your kidneys are a pair of bean-shaped organs. Each kidney is about the size of your fist. They are located below your ribs, one on each side of your spine. CAUSES Infections are caused by microbes, which are microscopic organisms, including fungi, viruses, and bacteria. These organisms are so small that they can only be seen through a microscope. Bacteria are the microbes that most commonly cause UTIs. SYMPTOMS  Symptoms of UTIs may vary by age and gender of the patient and by the location of the infection. Symptoms in young women typically include a frequent and intense urge to urinate and a painful, burning feeling in the bladder or urethra during urination. Older women and men are more likely to be tired, shaky, and weak and have muscle aches and abdominal pain. A fever may mean the infection is in your kidneys. Other symptoms of a kidney infection include pain in your back or sides below the ribs, nausea, and vomiting. DIAGNOSIS To diagnose a UTI, your caregiver will ask you about your symptoms. Your caregiver also will ask to provide a urine sample. The urine sample will be tested for bacteria and white blood cells. White blood cells are made by your body to help fight infection. TREATMENT  Typically, UTIs can be treated with medication. Because most UTIs are caused by a bacterial infection, they usually can be treated with the use of antibiotics. The choice of antibiotic and length of treatment depend on your symptoms and the  type of bacteria causing your infection. HOME CARE INSTRUCTIONS  If you were prescribed antibiotics, take them exactly as your caregiver instructs you. Finish the medication even if you feel better after you have only taken some of the medication.  Drink enough water and fluids to keep your urine clear or pale yellow.  Avoid caffeine, tea, and carbonated beverages. They tend to irritate your bladder.  Empty your bladder often. Avoid holding urine for long periods of time.  Empty your bladder before and after sexual intercourse.  After a bowel movement, women should cleanse from front to back. Use each tissue only once. SEEK MEDICAL CARE IF:   You have back pain.  You develop a fever.  Your symptoms do not begin to resolve within 3 days. SEEK IMMEDIATE MEDICAL CARE IF:   You have severe back pain or lower abdominal pain.  You develop chills.  You have nausea or vomiting.  You have continued burning or discomfort with urination. MAKE SURE YOU:   Understand these instructions.  Will watch your condition.  Will get help right away if you are not doing well or get worse. Document Released: 09/09/2005 Document Revised: 05/31/2012 Document Reviewed: 01/08/2012 [facility] Patient Information 2015 [place], [place]. This information is not intended to replace advice given to you by your health care provider. Make sure you discuss any questions you have with your health care provider.

## 2014-12-26 NOTE — ED Notes (Signed)
Pt reports a hx of frequent UTI's. Pt reports that she was recently treated for a UTI and just finished the antibiotics. Pt says that last night she started noticing blood in her urine again and is experiencing dysuria.

## 2014-12-26 NOTE — ED Provider Notes (Signed)
CSN: [id number]     Arrival date & time 12/26/14  1501 History  This chart was scribed for non-physician practitioner Brent General, PA-C, working with Debby Freiberg, MD by Zola Button, ED Scribe. This patient was seen in room WTR7/WTR7 and the patient's care was started at 5:56 PM.      Chief Complaint  Patient presents with  . Hematuria   The history is provided by the patient. No language interpreter was used.   HPI Comments: Candice Le is a 39 y.o. female with a hx of frequent UTI's for the past 1.5 years who presents to the Emergency Department complaining of hematuria that started last night. Patient was recently treated for a UTI and recently finished a course of Cipro. She notes that she had improvement on the medicine, but her symptoms, including dysuria, have returned since coming off the abx. She states she has not had a culture of her urine or any imaging of her kidneys before due to financial reasons. Patient also reports having back pain, but she remodels houses for work, so she does not know if the pain is due to her work or her urinary symptoms. She denies fever, vaginal bleeding, unusual vaginal discharge, abdominal pain, nausea, and vomiting. Patient denies prior hospitalization for pyelonephritis or anything similar. Her last catheterization was 10 years ago. She has not seen a urologist before.  History reviewed. No pertinent past medical history. Past Surgical History  Procedure Laterality Date  . Abdominal surgery    . Tubal ligation     No family history on file. History  Substance Use Topics  . Smoking status: Current Every Day Smoker  . Smokeless tobacco: Not on file  . Alcohol Use: No   OB History    No data available     Review of Systems  Constitutional: Negative for fever.  Gastrointestinal: Negative for nausea, vomiting and abdominal pain.  Genitourinary: Positive for dysuria and hematuria. Negative for vaginal bleeding and vaginal discharge.   Musculoskeletal: Positive for back pain.      Allergies  Sulfa antibiotics  Home Medications   Prior to Admission medications   Medication Sig Start Date End Date Taking? Authorizing Provider  Aspirin-Acetaminophen-Caffeine (GOODY HEADACHE PO) Take 1 packet by mouth daily as needed. Headache.    Historical Provider, MD  ibuprofen (ADVIL,MOTRIN) 800 MG tablet Take 800 mg by mouth every 8 (eight) hours as needed. As needed for headache.    Historical Provider, MD  nitrofurantoin, macrocrystal-monohydrate, (MACROBID) 100 MG capsule Take 1 capsule (100 mg total) by mouth 2 (two) times daily. X 7 days 12/26/14   Carrie Mew, PA-C  oxybutynin (DITROPAN XL) 5 MG 24 hr tablet Take 1 tablet (5 mg total) by mouth daily. 12/26/14   Carrie Mew, PA-C   BP 125/70 mmHg  Pulse 85  Temp(Src) 97.7 F (36.5 C) (Oral)  Resp 18  SpO2 100%  LMP 12/14/2014 (Approximate) Physical Exam  Constitutional: She is oriented to person, place, and time. She appears well-developed and well-nourished. No distress.  HENT:  Head: Normocephalic and atraumatic.  Mouth/Throat: Oropharynx is clear and moist. No oropharyngeal exudate.  Eyes: Pupils are equal, round, and reactive to light. Right eye exhibits no discharge. Left eye exhibits no discharge. No scleral icterus.  Neck: Normal range of motion. Neck supple.  Cardiovascular: Normal rate, regular rhythm and normal heart sounds.   No murmur heard. Pulmonary/Chest: Effort normal and breath sounds normal. No respiratory distress.  Abdominal: Soft. Normal appearance  and bowel sounds are normal. There is no tenderness. There is no rigidity, no guarding, no CVA tenderness, no tenderness at McBurney's point and negative Murphy's sign.  Musculoskeletal: Normal range of motion. She exhibits no edema or tenderness.  Neurological: She is alert and oriented to person, place, and time. She has normal strength. No cranial nerve deficit or sensory deficit. Coordination  normal. GCS eye subscore is 4. GCS verbal subscore is 5. GCS motor subscore is 6.  Skin: Skin is warm and dry. No rash noted. She is not diaphoretic.  Psychiatric: She has a normal mood and affect. Her behavior is normal.  Nursing note and vitals reviewed.   ED Course  Procedures  DIAGNOSTIC STUDIES: Oxygen Saturation is 100% on room air, normal by my interpretation.    COORDINATION OF CARE: 6:05 PM-Discussed treatment plan which includes labs with pt at bedside and pt agreed to plan.    Labs Review Labs Reviewed  URINALYSIS, ROUTINE W REFLEX MICROSCOPIC - Abnormal; Notable for the following:    APPearance CLOUDY (*)    Hgb urine dipstick SMALL (*)    Leukocytes, UA TRACE (*)    All other components within normal limits  URINE MICROSCOPIC-ADD ON - Abnormal; Notable for the following:    Bacteria, UA MANY (*)    All other components within normal limits  URINE CULTURE    Imaging Review No results found.   EKG Interpretation None      MDM   Final diagnoses:  UTI (lower urinary tract infection)    Pt has been diagnosed with a UTI. Pt is afebrile, no CVA tenderness, normotensive, and denies N/V. Pt to be dc home with antibiotics and instructions to follow up with PCP. Patient possibly experiencing poor susceptibility to ciprofloxacin with recurrent UTIs. We'll place patient on Macrobid and send urine culture tonight. Patient strongly encouraged to follow-up with her primary care physician, as her PCP has recently recommended a urology follow-up. I stressed the importance of this with patient, and patient verbalizes understanding. I discussed return precautions patient, and patient verbalize understanding and agreement of the plan. I encouraged patient call or return to the ER should she have any questions or concerns.  I personally performed the services described in this documentation, which was scribed in my presence. The recorded information has been reviewed and is  accurate.  BP 125/70 mmHg  Pulse 85  Temp(Src) 97.7 F (36.5 C) (Oral)  Resp 18  SpO2 100%  LMP 12/14/2014 (Approximate)  Signed,  Dahlia Bailiff, PA-C 9:49 PM    Carrie Mew, PA-C 12/26/14 2149  Debby Freiberg, MD 12/28/14 [phone]

## 2014-12-28 LAB — URINE CULTURE

## 2014-12-31 ENCOUNTER — Telehealth (HOSPITAL_COMMUNITY): Payer: Self-pay

## 2014-12-31 NOTE — Telephone Encounter (Signed)
Post ED Visit - Positive Culture Follow-up  Culture report reviewed by antimicrobial stewardship pharmacist: []  Wes Dulaney, Pharm.D., BCPS []  Heide Guile, Pharm.D., BCPS []  Alycia Rossetti, Pharm.D., BCPS []  [place], Pharm.D., BCPS, AAHIVP []  Legrand Como, Pharm.D., BCPS, AAHIVP []  Isac Sarna, Pharm.D., BCPS  Positive Urine culture, >/= 100,000 colonies -> E Coli Treated with Nitrofurantoin, organism sensitive to the same and no further patient follow-up is required at this time.  Dortha Kern 12/31/2014, 5:16 AM

## 2015-04-16 ENCOUNTER — Encounter (HOSPITAL_COMMUNITY): Payer: Self-pay | Admitting: *Deleted

## 2015-04-16 ENCOUNTER — Emergency Department (HOSPITAL_COMMUNITY)
Admission: EM | Admit: 2015-04-16 | Discharge: 2015-04-16 | Disposition: A | Discharge status: Home / Self Care | Payer: Self-pay | Attending: Emergency Medicine | Admitting: Emergency Medicine

## 2015-04-16 DIAGNOSIS — Z720 Tobacco use: Secondary | ICD-10-CM | POA: Insufficient documentation

## 2015-04-16 DIAGNOSIS — N12 Tubulo-interstitial nephritis, not specified as acute or chronic: Secondary | ICD-10-CM | POA: Insufficient documentation

## 2015-04-16 DIAGNOSIS — Z79899 Other long term (current) drug therapy: Secondary | ICD-10-CM | POA: Insufficient documentation

## 2015-04-16 LAB — URINALYSIS, ROUTINE W REFLEX MICROSCOPIC
Bilirubin Urine: NEGATIVE
GLUCOSE, UA: NEGATIVE mg/dL
Ketones, ur: NEGATIVE mg/dL
Nitrite: NEGATIVE
Protein, ur: NEGATIVE mg/dL
Specific Gravity, Urine: 1.005 (ref 1.005–1.030)
Urobilinogen, UA: 0.2 mg/dL (ref 0.0–1.0)
pH: 7 (ref 5.0–8.0)

## 2015-04-16 LAB — URINE MICROSCOPIC-ADD ON

## 2015-04-16 MED ORDER — CEPHALEXIN 500 MG PO CAPS: 500.0000 mg | ORAL_CAPSULE | Freq: Two times a day (BID) | ORAL | Status: DC

## 2015-04-16 MED ORDER — PHENAZOPYRIDINE HCL 200 MG PO TABS: 200.0000 mg | ORAL_TABLET | Freq: Three times a day (TID) | ORAL | Status: DC

## 2015-04-16 MED ORDER — IBUPROFEN 400 MG PO TABS: 400.0000 mg | ORAL_TABLET | Freq: Four times a day (QID) | ORAL | Status: DC | PRN

## 2015-04-16 NOTE — ED Provider Notes (Signed)
CSN: [id number]     Arrival date & time 04/16/15  1256 History   First MD Initiated Contact with Patient 04/16/15 1453     Chief Complaint  Patient presents with  . Flank Pain     (Consider location/radiation/quality/duration/timing/severity/associated sxs/prior Treatment) HPI Comments: Pt with hx of recurrent UTI, started on macrobid yday comes in with back pain. Pt has dysuria, nausea, and right sided flank pain. Pt's flank pain started today. Pain is constant, with intermittent sharp throbbing. There is nausea but no emesis. Pt has no fevers, chills. No hx of renal stones and she is s/p appendectomy.  Patient is a 39 y.o. female presenting with flank pain. The history is provided by the patient.  Flank Pain Pertinent negatives include no chest pain, no abdominal pain, no headaches and no shortness of breath.    History reviewed. No pertinent past medical history. Past Surgical History  Procedure Laterality Date  . Abdominal surgery    . Tubal ligation     No family history on file. History  Substance Use Topics  . Smoking status: Current Every Day Smoker  . Smokeless tobacco: Not on file  . Alcohol Use: No   OB History    No data available     Review of Systems  Constitutional: Negative for chills and activity change.  Respiratory: Negative for shortness of breath.   Cardiovascular: Negative for chest pain.  Gastrointestinal: Positive for nausea. Negative for vomiting and abdominal pain.  Genitourinary: Positive for flank pain. Negative for dysuria.  Musculoskeletal: Negative for neck pain.  Neurological: Negative for headaches.      Allergies  Sulfa antibiotics  Home Medications   Prior to Admission medications   Medication Sig Start Date End Date Taking? Authorizing Provider  Aspirin-Acetaminophen-Caffeine (GOODY HEADACHE PO) Take 1 packet by mouth daily as needed. Headache.   Yes Historical Provider, MD  zolpidem (AMBIEN) 5 MG tablet Take 5 mg by mouth at  bedtime as needed for sleep.   Yes Historical Provider, MD  cephALEXin (KEFLEX) 500 MG capsule Take 1 capsule (500 mg total) by mouth 2 (two) times daily. 04/16/15   Varney Biles, MD  ibuprofen (ADVIL,MOTRIN) 400 MG tablet Take 1 tablet (400 mg total) by mouth every 6 (six) hours as needed for headache. 04/16/15   Varney Biles, MD  phenazopyridine (PYRIDIUM) 200 MG tablet Take 1 tablet (200 mg total) by mouth 3 (three) times daily. 04/16/15   Quincee Gittens, MD   BP 108/70 mmHg  Pulse 74  Temp(Src) 98 F (36.7 C) (Oral)  Resp 16  SpO2 98% Physical Exam  Constitutional: She is oriented to person, place, and time. She appears well-developed and well-nourished.  HENT:  Head: Normocephalic and atraumatic.  Eyes: EOM are normal. Pupils are equal, round, and reactive to light.  Neck: Neck supple.  Cardiovascular: Normal rate, regular rhythm and normal heart sounds.   No murmur heard. Pulmonary/Chest: Effort normal. No respiratory distress.  Abdominal: Soft. She exhibits no distension. There is no tenderness. There is no rebound and no guarding.  Genitourinary:  Mild R flank tenderness  Neurological: She is alert and oriented to person, place, and time.  Skin: Skin is warm and dry.  Nursing note and vitals reviewed.   ED Course  Procedures (including critical care time) Labs Review Labs Reviewed  URINALYSIS, ROUTINE W REFLEX MICROSCOPIC - Abnormal; Notable for the following:    APPearance HAZY (*)    Hgb urine dipstick MODERATE (*)    Leukocytes, UA  MODERATE (*)    All other components within normal limits  URINE MICROSCOPIC-ADD ON - Abnormal; Notable for the following:    Squamous Epithelial / LPF FEW (*)    Bacteria, UA FEW (*)    All other components within normal limits  URINE CULTURE    Imaging Review No results found.   EKG Interpretation None      MDM   Final diagnoses:  Pyelonephritis    Pt with right sided flank pain and uti like sx. Pt also has some  nausea. Concerns for pyelonephritis. Pt has no SIRs criteria and her vitals are WNL/. No clinical concerns for renal stones or any surgical pathology. Will start keflex, asked to stop macrobid.    Varney Biles, MD 04/16/15 1625

## 2015-04-16 NOTE — Discharge Instructions (Signed)
Please return to the ER if your symptoms worsen; you have increased pain, fevers, chills, inability to keep any medications down, confusion. Otherwise see the outpatient doctor as requested.   Pyelonephritis, Adult Pyelonephritis is a kidney infection. In general, there are 2 main types of pyelonephritis:  Infections that come on quickly without any warning (acute pyelonephritis).  Infections that persist for a long period of time (chronic pyelonephritis). CAUSES  Two main causes of pyelonephritis are:  Bacteria traveling from the bladder to the kidney. This is a problem especially in pregnant women. The urine in the bladder can become filled with bacteria from multiple causes, including:  Inflammation of the prostate gland (prostatitis).  Sexual intercourse in females.  Bladder infection (cystitis).  Bacteria traveling from the bloodstream to the tissue part of the kidney. Problems that may increase your risk of getting a kidney infection include:  Diabetes.  Kidney stones or bladder stones.  Cancer.  Catheters placed in the bladder.  Other abnormalities of the kidney or ureter. SYMPTOMS   Abdominal pain.  Pain in the side or flank area.  Fever.  Chills.  Upset stomach.  Blood in the urine (dark urine).  Frequent urination.  Strong or persistent urge to urinate.  Burning or stinging when urinating. DIAGNOSIS  Your caregiver may diagnose your kidney infection based on your symptoms. A urine sample may also be taken. TREATMENT  In general, treatment depends on how severe the infection is.   If the infection is mild and caught early, your caregiver may treat you with oral antibiotics and send you home.  If the infection is more severe, the bacteria may have gotten into the bloodstream. This will require intravenous (IV) antibiotics and a hospital stay. Symptoms may include:  High fever.  Severe flank pain.  Shaking chills.  Even after a hospital stay,  your caregiver may require you to be on oral antibiotics for a period of time.  Other treatments may be required depending upon the cause of the infection. HOME CARE INSTRUCTIONS   Take your antibiotics as directed. Finish them even if you start to feel better.  Make an appointment to have your urine checked to make sure the infection is gone.  Drink enough fluids to keep your urine clear or pale yellow.  Take medicines for the bladder if you have urgency and frequency of urination as directed by your caregiver. SEEK IMMEDIATE MEDICAL CARE IF:   You have a fever or persistent symptoms for more than 2-3 days.  You have a fever and your symptoms suddenly get worse.  You are unable to take your antibiotics or fluids.  You develop shaking chills.  You experience extreme weakness or fainting.  There is no improvement after 2 days of treatment. MAKE SURE YOU:  Understand these instructions.  Will watch your condition.  Will get help right away if you are not doing well or get worse. Document Released: 11/30/2005 Document Revised: 05/31/2012 Document Reviewed: 05/06/2011 [facility] Patient Information 2015 [place], [place]. This information is not intended to replace advice given to you by your health care provider. Make sure you discuss any questions you have with your health care provider.

## 2015-04-16 NOTE — ED Notes (Signed)
Pt states that she has had rt flank pain and nausea. Pt was given antibiotics for recent UTI as well.

## 2015-04-17 LAB — URINE CULTURE: SPECIAL REQUESTS: NORMAL

## 2016-02-13 ENCOUNTER — Other Ambulatory Visit: Payer: Self-pay | Admitting: Obstetrics and Gynecology

## 2016-02-13 DIAGNOSIS — N63 Unspecified lump in unspecified breast: Secondary | ICD-10-CM

## 2016-02-20 ENCOUNTER — Other Ambulatory Visit: Payer: Self-pay

## 2016-03-02 ENCOUNTER — Ambulatory Visit
Admission: RE | Admit: 2016-03-02 | Discharge: 2016-03-02 | Disposition: A | Discharge status: Home / Self Care | Payer: Managed Care, Other (non HMO) | Source: Ambulatory Visit | Attending: Obstetrics and Gynecology | Admitting: Obstetrics and Gynecology

## 2016-03-02 DIAGNOSIS — N63 Unspecified lump in unspecified breast: Secondary | ICD-10-CM

## 2016-09-08 ENCOUNTER — Inpatient Hospital Stay (HOSPITAL_COMMUNITY)
Admission: AD | Admit: 2016-09-08 | Discharge: 2016-09-08 | Disposition: A | Discharge status: Home / Self Care | Payer: Managed Care, Other (non HMO) | Source: Ambulatory Visit | Attending: Obstetrics and Gynecology | Admitting: Obstetrics and Gynecology

## 2016-09-08 ENCOUNTER — Inpatient Hospital Stay (HOSPITAL_COMMUNITY): Payer: Managed Care, Other (non HMO)

## 2016-09-08 ENCOUNTER — Encounter (HOSPITAL_COMMUNITY): Payer: Self-pay

## 2016-09-08 DIAGNOSIS — R1032 Left lower quadrant pain: Secondary | ICD-10-CM | POA: Diagnosis present

## 2016-09-08 DIAGNOSIS — R102 Pelvic and perineal pain: Secondary | ICD-10-CM | POA: Diagnosis not present

## 2016-09-08 DIAGNOSIS — F17200 Nicotine dependence, unspecified, uncomplicated: Secondary | ICD-10-CM | POA: Diagnosis not present

## 2016-09-08 HISTORY — DX: Other specified health status: Z78.9

## 2016-09-08 LAB — URINALYSIS, ROUTINE W REFLEX MICROSCOPIC
BILIRUBIN URINE: NEGATIVE
GLUCOSE, UA: NEGATIVE mg/dL
KETONES UR: NEGATIVE mg/dL
LEUKOCYTES UA: NEGATIVE
Nitrite: NEGATIVE
PROTEIN: NEGATIVE mg/dL
Specific Gravity, Urine: 1.01 (ref 1.005–1.030)
pH: 5.5 (ref 5.0–8.0)

## 2016-09-08 LAB — URINE MICROSCOPIC-ADD ON

## 2016-09-08 LAB — POCT PREGNANCY, URINE: Preg Test, Ur: NEGATIVE

## 2016-09-08 MED ORDER — IBUPROFEN 600 MG PO TABS: 600.0000 mg | ORAL_TABLET | Freq: Four times a day (QID) | ORAL | 0 refills | Status: DC | PRN

## 2016-09-08 MED ORDER — IBUPROFEN 800 MG PO TABS
800.0000 mg | ORAL_TABLET | Freq: Once | ORAL | Status: AC
  Administered 2016-09-08: 800 mg via ORAL
  Filled 2016-09-08: qty 1

## 2016-09-08 MED ORDER — OXYCODONE-ACETAMINOPHEN 5-325 MG PO TABS: 1.0000 | ORAL_TABLET | ORAL | 0 refills | Status: DC | PRN
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
## 2016-09-08 NOTE — MAU Provider Note (Signed)
History     CSN: [id number]  Arrival date and time: 09/08/16 1255   First Provider Initiated Contact with Patient 09/08/16 1346      Chief Complaint  Patient presents with  . Abdominal Pain   Candice Le is a 40 y.o. G4P4 presenting with left lower quadrant pain. She states she's had this pain about twice a year for the past several years until she experienced it each month for the past 3. Usually it is self-limited and occurs premenstrually however she is concerned that menses started today without abatement of thie pain. She had a tubal ligation several years ago and had left tube clamped; she attributes the pain to the clamp being present.  Mutually monogamous relationship, no abnormal vaginal discharge and declines STI testing.    Abdominal Pain  This is a chronic problem. The current episode started yesterday. The onset quality is gradual. The problem occurs constantly. The problem has been waxing and waning. The pain is located in the LLQ. The pain is at a severity of 8/10. The pain is moderate. The quality of the pain is sharp (Begins as suprapubic cramping and progresses to sharp knifelike pain). The abdominal pain does not radiate. Pertinent negatives include no anorexia, constipation, diarrhea, dysuria, fever, hematuria, nausea, vomiting or weight loss. The pain is aggravated by movement and certain positions. She has tried nothing for the symptoms. Her past medical history is significant for abdominal surgery. There is no history of Crohn's disease, gallstones, irritable bowel syndrome or ulcerative colitis. Had similar pain on the right several years ago and exploratory lap was done followed by another abdominal surgery to remove diverticulum    OB History  Gravida Para Term Preterm AB Living  4 4       4   SAB TAB Ectopic Multiple Live Births          4    # Outcome Date GA Lbr Len/2nd Weight Sex Delivery Anes PTL Lv  4 Para           3 Para           2 Para           1  Para                Past Medical History:  Diagnosis Date  . Medical history non-contributory     Past Surgical History:  Procedure Laterality Date  . ABDOMINAL SURGERY    . TUBAL LIGATION      Family History  Problem Relation Age of Onset  . Hypertension Mother     Social History  Substance Use Topics  . Smoking status: Current Every Day Smoker  . Smokeless tobacco: Never Used  . Alcohol use No    Allergies:  Allergies  Allergen Reactions  . Other     Lettuce, inflammation  . Pork-Derived Products     Dizzy,ringing ears,hot flash  . Sulfa Antibiotics Other (See Comments)    "makes me feel like I'm dying."    Prescriptions Prior to Admission  Medication Sig Dispense Refill Last Dose  . Melatonin 10 MG TABS Take 1 tablet by mouth daily.   Past Week at Unknown time    Review of Systems  Constitutional: Negative for chills, fever, malaise/fatigue and weight loss.  Respiratory: Negative for cough.   Cardiovascular: Negative for chest pain.  Gastrointestinal: Positive for abdominal pain. Negative for anorexia, constipation, diarrhea, nausea and vomiting.  Genitourinary: Negative for dysuria and hematuria.  NEFG Vagina with scant white discharge Cx motion elicits pain on left No adnexal mass. Left adnexal tenderness present  Neurological: Negative for dizziness.  Psychiatric/Behavioral: The patient is not nervous/anxious.    Physical Exam   Blood pressure 132/71, pulse 77, temperature 98.5 F (36.9 C), temperature source Oral, resp. rate 18, height 5\' 8"  (1.727 m), weight 68 kg (150 lb), last menstrual period 09/08/2016.  Physical Exam  Nursing note and vitals reviewed. Constitutional: She is oriented to person, place, and time. She appears well-developed and well-nourished. She appears distressed.  Looks uncomfortable  HENT:  Head: Normocephalic.  Neck: Neck supple.  Cardiovascular: Normal rate.   Respiratory: Effort normal.  GI: Soft. She  exhibits no distension and no mass. There is tenderness. There is rebound. There is no guarding.  Left suprapubic TTP  Genitourinary: No vaginal discharge found.  Genitourinary Comments: NEFG Vaginal with scant physiologic discharge Cx motion elicits pain on left Uterus NSSP No adnexal masses. Left adnexal tenderness present  Musculoskeletal: Normal range of motion.  Neurological: She is alert and oriented to person, place, and time.  Skin: Skin is warm and dry.  Psychiatric: She has a normal mood and affect.    MAU Course  Procedures Results for orders placed or performed during the hospital encounter of 09/08/16 (from the past 24 hour(s))  Urinalysis, Routine w reflex microscopic (not at [facility])     Status: Abnormal   Collection Time: 09/08/16  1:10 PM  Result Value Ref Range   Color, Urine YELLOW YELLOW   APPearance CLEAR CLEAR   Specific Gravity, Urine 1.010 1.005 - 1.030   pH 5.5 5.0 - 8.0   Glucose, UA NEGATIVE NEGATIVE mg/dL   Hgb urine dipstick SMALL (A) NEGATIVE   Bilirubin Urine NEGATIVE NEGATIVE   Ketones, ur NEGATIVE NEGATIVE mg/dL   Protein, ur NEGATIVE NEGATIVE mg/dL   Nitrite NEGATIVE NEGATIVE   Leukocytes, UA NEGATIVE NEGATIVE  Urine microscopic-add on     Status: Abnormal   Collection Time: 09/08/16  1:10 PM  Result Value Ref Range   Squamous Epithelial / LPF 0-5 (A) NONE SEEN   WBC, UA 0-5 0 - 5 WBC/hpf   RBC / HPF 0-5 0 - 5 RBC/hpf   Bacteria, UA RARE (A) NONE SEEN  Pregnancy, urine POC     Status: None   Collection Time: 09/08/16  1:16 PM  Result Value Ref Range   Preg Test, Ur NEGATIVE NEGATIVE   US Transvaginal Non-ob  Result Date: 09/08/2016 CLINICAL DATA:  Female pelvic pain. Left-sided pain. Bilateral tubal ligation. EXAM: TRANSABDOMINAL AND TRANSVAGINAL ULTRASOUND OF PELVIS TECHNIQUE: Both transabdominal and transvaginal ultrasound examinations of the pelvis were performed. Transabdominal technique was performed for global imaging of the pelvis  including uterus, ovaries, adnexal regions, and pelvic cul-de-sac. It was necessary to proceed with endovaginal exam following the transabdominal exam to visualize the endometrium and ovaries. COMPARISON:  None FINDINGS: Uterus Measurements: 10.1 x 4.5 x 6.1 cm. No fibroids or other mass visualized. Endometrium Thickness: 9.6 mm.  No focal abnormality visualized. Right ovary Measurements: 2.5 x 1.6 x 1.9 cm. Normal appearance/no adnexal mass. Left ovary Measurements: 2.7 x 1.5 x 2.3 cm. Normal appearance/no adnexal mass. Other findings Small amount of pelvic free fluid likely physiologic. IMPRESSION: Normal pelvic ultrasound. Electronically Signed   By: Kathreen Devoid   On: 09/08/2016 16:23   US Pelvis Complete  Result Date: 09/08/2016 CLINICAL DATA:  Female pelvic pain. Left-sided pain. Bilateral tubal ligation. EXAM: TRANSABDOMINAL AND TRANSVAGINAL ULTRASOUND  OF PELVIS TECHNIQUE: Both transabdominal and transvaginal ultrasound examinations of the pelvis were performed. Transabdominal technique was performed for global imaging of the pelvis including uterus, ovaries, adnexal regions, and pelvic cul-de-sac. It was necessary to proceed with endovaginal exam following the transabdominal exam to visualize the endometrium and ovaries. COMPARISON:  None FINDINGS: Uterus Measurements: 10.1 x 4.5 x 6.1 cm. No fibroids or other mass visualized. Endometrium Thickness: 9.6 mm.  No focal abnormality visualized. Right ovary Measurements: 2.5 x 1.6 x 1.9 cm. Normal appearance/no adnexal mass. Left ovary Measurements: 2.7 x 1.5 x 2.3 cm. Normal appearance/no adnexal mass. Other findings Small amount of pelvic free fluid likely physiologic. IMPRESSION: Normal pelvic ultrasound. Electronically Signed   By: Kathreen Devoid   On: 09/08/2016 16:23   Declined IM or narcotic analgesic. Ibuprofen 800mg  po given with some relief C/W Dr. Harrington Challenger Assessment and Plan  Pain, female pelvic - Plan: US Transvaginal Non-OB, US Transvaginal  Non-OB, Discharge patient  1. Pain, female pelvic      Medication List    TAKE these medications   ibuprofen 600 MG tablet Commonly known as:  ADVIL,MOTRIN Take 1 tablet (600 mg total) by mouth every 6 (six) hours as needed.   Melatonin 10 MG Tabs Take 1 tablet by mouth daily.   oxyCODONE-acetaminophen 5-325 MG tablet Commonly known as:  PERCOCET/ROXICET Take 1 tablet by mouth every 4 (four) hours as needed for severe pain.     #6 given for breakthrough pain   Follow-up Information    Olga Millers, MD. Schedule an appointment as soon as possible for a visit in 1 month(s).   Specialty:  Obstetrics and Gynecology Contact information: [place] [address] [phone]          Sherilee Smotherman 09/08/2016, 1:48 PM

## 2016-09-08 NOTE — Discharge Instructions (Signed)

## 2017-02-18 ENCOUNTER — Other Ambulatory Visit: Payer: Self-pay | Admitting: Obstetrics and Gynecology

## 2017-02-22 LAB — CYTOLOGY - PAP

## 2017-03-25 ENCOUNTER — Other Ambulatory Visit: Payer: Self-pay | Admitting: Obstetrics and Gynecology

## 2017-03-25 DIAGNOSIS — R928 Other abnormal and inconclusive findings on diagnostic imaging of breast: Secondary | ICD-10-CM

## 2017-03-30 ENCOUNTER — Ambulatory Visit
Admission: RE | Admit: 2017-03-30 | Discharge: 2017-03-30 | Disposition: A | Discharge status: Home / Self Care | Payer: BLUE CROSS/BLUE SHIELD | Source: Ambulatory Visit | Attending: Obstetrics and Gynecology | Admitting: Obstetrics and Gynecology

## 2017-03-30 DIAGNOSIS — R928 Other abnormal and inconclusive findings on diagnostic imaging of breast: Secondary | ICD-10-CM

## 2017-09-13 ENCOUNTER — Emergency Department
Admission: EM | Admit: 2017-09-13 | Discharge: 2017-09-13 | Disposition: A | Discharge status: Home / Self Care | Payer: BLUE CROSS/BLUE SHIELD | Attending: Emergency Medicine | Admitting: Emergency Medicine

## 2017-09-13 ENCOUNTER — Encounter: Payer: Self-pay | Admitting: Emergency Medicine

## 2017-09-13 DIAGNOSIS — Z23 Encounter for immunization: Secondary | ICD-10-CM | POA: Insufficient documentation

## 2017-09-13 DIAGNOSIS — Y929 Unspecified place or not applicable: Secondary | ICD-10-CM | POA: Diagnosis not present

## 2017-09-13 DIAGNOSIS — Y998 Other external cause status: Secondary | ICD-10-CM | POA: Diagnosis not present

## 2017-09-13 DIAGNOSIS — W450XXA Nail entering through skin, initial encounter: Secondary | ICD-10-CM | POA: Insufficient documentation

## 2017-09-13 DIAGNOSIS — S41132A Puncture wound without foreign body of left upper arm, initial encounter: Secondary | ICD-10-CM | POA: Diagnosis present

## 2017-09-13 DIAGNOSIS — F17200 Nicotine dependence, unspecified, uncomplicated: Secondary | ICD-10-CM | POA: Diagnosis not present

## 2017-09-13 DIAGNOSIS — Y93H9 Activity, other involving exterior property and land maintenance, building and construction: Secondary | ICD-10-CM | POA: Insufficient documentation

## 2017-09-13 MED ORDER — AMOXICILLIN-POT CLAVULANATE 875-125 MG PO TABS: 1.0000 | ORAL_TABLET | Freq: Two times a day (BID) | ORAL | 0 refills | Status: AC

## 2017-09-13 MED ORDER — TETANUS-DIPHTH-ACELL PERTUSSIS 5-2.5-18.5 LF-MCG/0.5 IM SUSP
0.5000 mL | Freq: Once | INTRAMUSCULAR | Status: AC
  Administered 2017-09-13: 0.5 mL via INTRAMUSCULAR
  Filled 2017-09-13: qty 0.5

## 2017-09-13 MED ORDER — IBUPROFEN 800 MG PO TABS: 800.0000 mg | ORAL_TABLET | Freq: Three times a day (TID) | ORAL | 0 refills | Status: DC | PRN

## 2017-09-13 MED ORDER — AMOXICILLIN-POT CLAVULANATE 875-125 MG PO TABS
1.0000 | ORAL_TABLET | Freq: Once | ORAL | Status: AC
  Administered 2017-09-13: 1 via ORAL
  Filled 2017-09-13: qty 1

## 2017-09-13 NOTE — Discharge Instructions (Signed)
Please continue with compression dressing for 24 hours, then apply Band-Aid daily. Take antibiotics and ibuprofen as prescribed.If no improvement in 7-10 days, follow-up with orthopedics. Return to the emergency department for any increase in swelling, redness, drainage.

## 2017-09-13 NOTE — ED Triage Notes (Signed)
While taking down a deck down today, a deck board snapped back and a nail went into left forearm.  C/O pain to left forearm.

## 2017-09-13 NOTE — ED Provider Notes (Signed)
[facility] Provider Note   CSN: [id number] Arrival date & time: 09/13/17  4268     History   Chief Complaint Chief Complaint  Patient presents with  . Puncture Wound    HPI Candice Le is a 41 y.o. female presents to the emergency department for evaluation of puncture wound to the left lateralforearm. Patient states she was removing decks om a board,, she had 1 and a deck board raised when it fell, the nail punctured her left lateral dorsal forearm just distal to the lateral epicondyle. She has continued to work and remove decking towards She comes in todaetanus shot. She denies any numbness or tingling. She has some pain with wrist range of motion. Pain is moderate. She has not had a medication for pain.  HPI  Past Medical History:  Diagnosis Date  . Medical history non-contributory     There are no active problems to display for this patient.   Past Surgical History:  Procedure Laterality Date  . ABDOMINAL SURGERY    . TUBAL LIGATION      OB History    Gravida Para Term Preterm AB Living   4 4       4    SAB TAB Ectopic Multiple Live Births           4       Home Medications    Prior to Admission medications   Medication Sig Start Date End Date Taking? Authorizing Provider  amoxicillin-clavulanate (AUGMENTIN) 875-125 MG tablet Take 1 tablet by mouth 2 (two) times daily. 09/13/17 09/23/17  Duanne Guess, PA-C  ibuprofen (ADVIL,MOTRIN) 800 MG tablet Take 1 tablet (800 mg total) by mouth every 8 (eight) hours as needed. 09/13/17   Duanne Guess, PA-C  Melatonin 10 MG TABS Take 1 tablet by mouth daily.    [provider]  oxyCODONE-acetaminophen (PERCOCET/ROXICET) 5-325 MG tablet Take 1 tablet by mouth every 4 (four) hours as needed for severe pain. 09/08/16   Poe, Deirdre Loletha Grayer, CNM    Family History Family History  Problem Relation Age of Onset  . Hypertension Mother   . Breast cancer Paternal Aunt     Social History Social  History  Substance Use Topics  . Smoking status: Current Every Day Smoker  . Smokeless tobacco: Never Used  . Alcohol use No     Allergies   Other; Pork-derived products; and Sulfa antibiotics   Review of Systems Review of Systems  Constitutional: Negative for activity change and fever.  Respiratory: Negative for shortness of breath.   Cardiovascular: Negative for chest pain.  Musculoskeletal: Negative for arthralgias, gait problem, myalgias and neck pain.  Skin: Positive for wound. Negative for rash.  Neurological: Negative for weakness and numbness.  Hematological: Negative for adenopathy.  Psychiatric/Behavioral: Negative for agitation, behavioral problems and confusion.     Physical Exam Updated Vital Signs BP 139/74   Pulse 76   Temp 99.6 F (37.6 C) (Oral)   Resp 16   Ht 5\' 9"  (1.753 m)   Wt 67.1 kg (148 lb)   LMP 08/17/2017   SpO2 100%   BMI 21.86 kg/m   Physical Exam  Constitutional: She is oriented to person, place, and time. She appears well-developed and well-nourished. No distress.  HENT:  Head: Normocephalic and atraumatic.  Mouth/Throat: Oropharynx is clear and moist.  Eyes: Conjunctivae are normal. Right eye exhibits no discharge. Left eye exhibits no discharge.  Neck: Normal range of motion.  Cardiovascular: Normal rate.  Pulmonary/Chest: No respiratory distress.  Musculoskeletal: Normal range of motion. She exhibits no deformity.  Examination of the left lateral epicondyle left left elbow shows no tenderness to palpation. Just distally there is a small 0.5 cm puncture wound with no visible or palpable foreign body. She is tender to palpation, no soft tissue swelling noted. She has full range of motion of the elbow wrist and digits.  Neurological: She is alert and oriented to person, place, and time. She has normal reflexes.  Skin: Skin is warm and dry.  Psychiatric: She has a normal mood and affect. Her behavior is normal. Thought content normal.       ED Treatments / Results  Labs (all labs ordered are listed, but only abnormal results are displayed) Labs Reviewed - No data to display  EKG  EKG Interpretation None       Radiology No results found.  Procedures Procedures (including critical care time)  Medications Ordered in ED Medications  Tdap (BOOSTRIX) injection 0.5 mL (0.5 mLs Intramuscular Given 09/13/17 1826)  amoxicillin-clavulanate (AUGMENTIN) 875-125 MG per tablet 1 tablet (1 tablet Oral Given 09/13/17 1828)     Initial Impression / Assessment and Plan / ED Course  I have reviewed the triage vital signs and the nursing notes.  Pertinent labs & imaging results that were available during my care of the patient were reviewed by me and considered in my medical decision making (see chart for details).     41 year old female withpuncture wound to the left lateral forearm.Patient states nail was completely removed from the tissue. Wound was thoroughly irrigated and no visible or palpable foreign body was visualized. Discussed x-rays, patient elected not to proceed with x-rays Patient was placed on prophylactic antibiotics. Tetanus was updated. She will follow-up with orthopedics in 1 week if no improvement.  Final Clinical Impressions(s) / ED Diagnoses   Final diagnoses:  Puncture wound of left arm with complication, initial encounter    New Prescriptions New Prescriptions   AMOXICILLIN-CLAVULANATE (AUGMENTIN) 875-125 MG TABLET    Take 1 tablet by mouth 2 (two) times daily.   IBUPROFEN (ADVIL,MOTRIN) 800 MG TABLET    Take 1 tablet (800 mg total) by mouth every 8 (eight) hours as needed.     Duanne Guess, PA-C 09/13/17 1837    Schuyler Amor, MD 09/13/17 [id number]

## 2017-10-13 ENCOUNTER — Encounter: Payer: Self-pay | Admitting: Obstetrics and Gynecology

## 2017-10-13 ENCOUNTER — Ambulatory Visit (INDEPENDENT_AMBULATORY_CARE_PROVIDER_SITE_OTHER): Payer: BLUE CROSS/BLUE SHIELD | Admitting: Obstetrics and Gynecology

## 2017-10-13 VITALS — BP 120/80 | HR 69 | Ht 69.0 in | Wt 152.0 lb

## 2017-10-13 DIAGNOSIS — R1032 Left lower quadrant pain: Secondary | ICD-10-CM | POA: Diagnosis not present

## 2017-10-13 DIAGNOSIS — N946 Dysmenorrhea, unspecified: Secondary | ICD-10-CM

## 2017-10-13 MED ORDER — NORETHINDRONE ACETATE 5 MG PO TABS: 5.0000 mg | ORAL_TABLET | Freq: Every day | ORAL | 11 refills | Status: DC

## 2017-10-13 NOTE — Progress Notes (Signed)
Obstetrics & Gynecology Office Visit   Chief Complaint:  Chief Complaint  Patient presents with  . Gynecologic Exam    Left side Pelvic pain x few months  . painful intercourse    History of Present Illness: A G105P4 female presents today with a 2 year history of intermittent LLQ pain that she describes as sharp/twisting and is 10/10 at its worst. She states the frequency has increased and it occurs every other month 3-4 days before your menstruation and finishes 3-4 days into menstruation. She has tried NSAIDs and hot showers that are unhelpful. Pt has also been experiencing dyspareunia and notices it particularly around her cervix. She does have a history of tubal ligation (clip on left, cautery to right) in 2009. 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  A trial of depo provera was suggested to her in the past but she declines.  Menstrual cycles themselves are not particularly long or heavy.  She has not prior history of endometriosis or PID.  No associated bowl or GI symptoms.   Review of Systems: Review of Systems  Constitutional: Negative for chills and fever.  HENT: Negative for congestion.   Respiratory: Negative for cough and shortness of breath.   Cardiovascular: Negative for chest pain and palpitations.  Gastrointestinal: Positive for abdominal pain. Negative for constipation, diarrhea, heartburn, nausea and vomiting.  Genitourinary: Negative for dysuria, frequency and urgency.  Skin: Negative for itching and rash.  Neurological: Negative for dizziness and headaches.  Endo/Heme/Allergies: Negative for polydipsia.  Psychiatric/Behavioral: Negative for depression.     Past Medical History:  Past Medical History:  Diagnosis Date  . Medical history non-contributory     Past Surgical History:  Past Surgical History:  Procedure Laterality  Date  . ABDOMINAL SURGERY    . TUBAL LIGATION      Gynecologic History: Patient's last menstrual period was 09/13/2017 (exact date).  Obstetric History: G54P4  Family History:  Family History  Problem Relation Age of Onset  . Hypertension Mother   . Breast cancer Paternal Aunt 35    Social History:  Social History   Social History  . Marital status: Legally Separated    Spouse name: N/A  . Number of children: N/A  . Years of education: N/A   Occupational History  . Not on file.   Social History Main Topics  . Smoking status: Current Every Day Smoker  . Smokeless tobacco: Never Used  . Alcohol use No  . Drug use: No  . Sexual activity: Yes    Birth control/ protection: Surgical   Other Topics Concern  . Not on file   Social History Narrative  . No narrative on file    Allergies:  Allergies  Allergen Reactions  . Other     Lettuce, inflammation  . Pork-Derived Products     Dizzy,ringing ears,hot flash  . Sulfa Antibiotics Other (See Comments)    "makes me feel like I'm dying."    Medications: Prior to Admission medications   Medication Sig Start Date End Date Taking? Authorizing Provider  ciprofloxacin (CIPRO) 500 MG tablet Cipro 500 mg tablet  Take 1 tablet twice a day by oral route for 5 days.   Yes [provider]  zolpidem (AMBIEN) 10 MG tablet  09/23/17  Yes [provider]    Physical Exam Vitals:  Vitals:   10/13/17 0817  BP: 120/80  Pulse: 69   Patient's last menstrual period was 09/13/2017 (exact date).  General: NAD HEENT: normocephalic, anicteric Thyroid: no enlargement, no palpable nodules Pulmonary: No increased work of breathing Cardiovascular: RRR, distal pulses 2+ Abdomen: NABS, soft, non-tender, non-distended.  Umbilicus without lesions.  No hepatomegaly, splenomegaly or masses palpable. No evidence of hernia  Genitourinary:  External: Normal external female genitalia.  Normal urethral meatus, normal    Bartholin's and Skene's glands.    Vagina: Normal vaginal mucosa, no evidence of prolapse.    Cervix: Grossly normal in appearance, no bleeding  Uterus: Non-enlarged, mobile, normal contour.  No CMT  Adnexa: ovaries non-enlarged, no adnexal masses  Rectal: deferred  Lymphatic: no evidence of inguinal lymphadenopathy Extremities: no edema, erythema, or tenderness Neurologic: Grossly intact Psychiatric: mood appropriate, affect full  Female chaperone present for pelvic portions of the physical exam  Assessment: 41 y.o. G4P4 with chronic pelvic pain  Plan: Problem List Items Addressed This Visit    None    Visit Diagnoses    Left lower quadrant pain    -  Primary   Relevant Orders   US PELVIS TRANSVANGINAL NON-OB (TV ONLY)   Dysmenorrhea       Relevant Orders   US PELVIS TRANSVANGINAL NON-OB (TV ONLY)      We discussed the possible etiologies for pelvic pain in women.  Gynecologic causes may include endometriosis, adenomyosis, pelvic inflammatory disease (PID), ovarian cysts, ovarian or tubal torsion, and in rare case gynecologic malignancy such as cervical, uterine, or ovarian cancer.  In addition thee possibility of non-gynecologic etiologies such as urinary or GI tract pathology or disordered, as well as musculoskeletal problems.  The goal is to complete a basic work up in hopes of identifying the underlying cause which in turn will dictate treatment.  In the meantime supportive measures such as localized heat, and NSAIDs are reasonable first steps.     - Prescription drug database was not reviewed, UDS was not ordered - Transvaginal ultrasound ordered - Blood work obtained today No  - Cervical cultures No - Pap NIL 02/18/17 [place] - Smoker and >40 so contraindications to estrogen, trial norethindrone   Return in about 2 weeks (around 10/27/2017) for 1-2 weeks.

## 2017-10-14 DIAGNOSIS — Z9289 Personal history of other medical treatment: Secondary | ICD-10-CM | POA: Insufficient documentation

## 2017-10-14 NOTE — Progress Notes (Signed)
Gynecology Ultrasound Follow Up  Chief Complaint:  Chief Complaint  Patient presents with  . ultrasound follow up    GYN U/S     History of Present Illness: Patient is a 41 y.o. female who presents today for ultrasound evaluation of left lower quadrant pain.  Ultrasound demonstrates the following findgins Adnexa: no masses seen, mobile Uterus: Non-enlarged, no fibroids, with normal appearing endometrial stripe   Additional: no free fluid  Review of Systems: Review of Systems  Constitutional: Negative for chills and fever.  Gastrointestinal: Positive for abdominal pain. Negative for constipation, diarrhea, nausea and vomiting.    Past Medical History:  Past Medical History:  Diagnosis Date  . Medical history non-contributory     Past Surgical History:  Past Surgical History:  Procedure Laterality Date  . ABDOMINAL SURGERY    . TUBAL LIGATION      Gynecologic History:  No LMP recorded.   Family History:  Family History  Problem Relation Age of Onset  . Hypertension Mother   . Breast cancer Paternal Aunt 16    Social History:  Social History   Socioeconomic History  . Marital status: Legally Separated    Spouse name: Not on file  . Number of children: Not on file  . Years of education: Not on file  . Highest education level: Not on file  Social Needs  . Financial resource strain: Not on file  . Food insecurity - worry: Not on file  . Food insecurity - inability: Not on file  . Transportation needs - medical: Not on file  . Transportation needs - non-medical: Not on file  Occupational History  . Not on file  Tobacco Use  . Smoking status: Current Every Day Smoker  . Smokeless tobacco: Never Used  Substance and Sexual Activity  . Alcohol use: No  . Drug use: No  . Sexual activity: Yes    Birth control/protection: Surgical  Other Topics Concern  . Not on file  Social History Narrative  . Not on file    Allergies:  Allergies  Allergen  Reactions  . Other     Lettuce, inflammation  . Pork-Derived Products     Dizzy,ringing ears,hot flash  . Sulfa Antibiotics Other (See Comments)    "makes me feel like I'm dying."    Medications: Prior to Admission medications   Medication Sig Start Date End Date Taking? Authorizing Provider  ciprofloxacin (CIPRO) 500 MG tablet Cipro 500 mg tablet  Take 1 tablet twice a day by oral route for 5 days.    [provider]  norethindrone (AYGESTIN) 5 MG tablet Take 1 tablet (5 mg total) by mouth daily. 10/13/17   Malachy Mood, MD  zolpidem Lorrin Mais) 10 MG tablet  09/23/17   [provider]    Physical Exam Vitals: There were no vitals taken for this visit.  General: NAD HEENT: normocephalic, anicteric Pulmonary: No increased work of breathing Extremities: no edema, erythema, or tenderness Neurologic: Grossly intact, normal gait Psychiatric: mood appropriate, affect full   Assessment: 41 y.o. G4P4 with chronic LLQ pelvic pain  Plan: Problem List Items Addressed This Visit      Other   History of Papanicolaou smear of cervix    Other Visit Diagnoses    Left lower quadrant pain    -  Primary      1) Patient opts for definitive surgical management via hysterectomy. The risks of surgery were discussed in detail with the patient including but not limited  to: bleeding which may require transfusion or reoperation; infection which may require antibiotics; injury to bowel, bladder, ureters or other surrounding organs (With a literature reported rate of urinary tract injury of 1% quoted); need for additional procedures including laparotomy; thromboembolic phenomenon, incisional problems and other postoperative/anesthesia complications.  Patient was also advised that recovery procedure generally involves an overnight stay; and the  expected recovery time after a hysterectomy being in the range of 6-8 weeks.  Likelihood of success in alleviating the patient's symptoms  was discussed.  While definitive in regards to issues with menstural bleeding, pelvic pain if present preoperatively may continue and in fact worsen postoperatively.  She is aware that the procedure will render her unable to pursue childbearing in the future.   She was told that she will be contacted by our surgical scheduler regarding the time and date of her surgery; routine preoperative instructions of having nothing to eat or drink after midnight on the day prior to surgery and also coming to the hospital 1.5 hours prior to her time of surgery were also emphasized.  She was told she may be called for a preoperative appointment about a week prior to surgery and will be given further preoperative instructions at that visit.  Routine postoperative instructions will be reviewed with the patient and her family in detail after surgery. Printed patient education handouts about the procedure was given to the patient to review at home. - post TLH,BS cysto - preoperative pap obtained  A total of 15 minutes were spent in face-to-face contact with the patient during this encounter with over half of that time devoted to counseling and coordination of care.

## 2017-10-15 ENCOUNTER — Ambulatory Visit (INDEPENDENT_AMBULATORY_CARE_PROVIDER_SITE_OTHER): Payer: BLUE CROSS/BLUE SHIELD

## 2017-10-15 ENCOUNTER — Ambulatory Visit (INDEPENDENT_AMBULATORY_CARE_PROVIDER_SITE_OTHER): Payer: BLUE CROSS/BLUE SHIELD | Admitting: Obstetrics and Gynecology

## 2017-10-15 ENCOUNTER — Encounter: Payer: Self-pay | Admitting: Obstetrics and Gynecology

## 2017-10-15 VITALS — BP 112/74 | HR 74 | Ht 69.0 in | Wt 150.0 lb

## 2017-10-15 DIAGNOSIS — Z7189 Other specified counseling: Secondary | ICD-10-CM

## 2017-10-15 DIAGNOSIS — N946 Dysmenorrhea, unspecified: Secondary | ICD-10-CM | POA: Diagnosis not present

## 2017-10-15 DIAGNOSIS — R1032 Left lower quadrant pain: Secondary | ICD-10-CM

## 2017-10-15 DIAGNOSIS — Z9289 Personal history of other medical treatment: Secondary | ICD-10-CM

## 2017-10-18 ENCOUNTER — Telehealth: Payer: Self-pay | Admitting: Obstetrics and Gynecology

## 2017-10-18 NOTE — Telephone Encounter (Signed)
Lmtrc

## 2017-10-18 NOTE — Telephone Encounter (Signed)
-----   Message from Malachy Mood, MD sent at 10/18/2017 12:47 PM EST ----- Regarding: Surgery Surgery Date:   [id number]  Surgery Booking Request Patient Full Name: Candice Le MRN: [id number]  DOB: 15-Jun-1976  Surgeon: Malachy Mood, MD  Requested Surgery Date and Time: next 2-8 weeks Primary Diagnosis and Code: Chronic LLQ pelvic pain Secondary Diagnosis and Code:  Surgical Procedure: TLH/BS and cystoscopy L&D Notification:N/A Admission Status: observation Length of Surgery: 2hrs Special Case Needs: none H&P:  (date) Phone Interview or Office Pre-Admit: preadmit Interpreter: none Language:English Medical Clearance: none Special Scheduling Instructions: none

## 2017-10-25 NOTE — Telephone Encounter (Signed)
Patient is aware of Dec availability but will probably schedule in January due to holidays, work, Social research officer, government. Insurance underwriter and costs discussed. Ext given.

## 2017-10-25 NOTE — Telephone Encounter (Signed)
Lmtrc

## 2017-11-01 ENCOUNTER — Telehealth: Payer: Self-pay | Admitting: Obstetrics and Gynecology

## 2017-11-01 NOTE — Telephone Encounter (Signed)
Pt is calling needing a refill on he Ambien 10 Mg. Please advise

## 2017-11-01 NOTE — Telephone Encounter (Signed)
Pt has no future appointment scheduled. Please advise

## 2017-11-02 ENCOUNTER — Encounter: Payer: Self-pay | Admitting: Obstetrics and Gynecology

## 2017-11-02 NOTE — Telephone Encounter (Signed)
Was not prescribed by me, does she have a PCP?

## 2017-11-02 NOTE — Telephone Encounter (Signed)
Pt aware.

## 2018-05-05 ENCOUNTER — Ambulatory Visit: Payer: BLUE CROSS/BLUE SHIELD | Admitting: Obstetrics and Gynecology

## 2018-05-19 ENCOUNTER — Ambulatory Visit: Payer: BLUE CROSS/BLUE SHIELD | Admitting: Obstetrics and Gynecology

## 2018-05-25 ENCOUNTER — Ambulatory Visit: Payer: BLUE CROSS/BLUE SHIELD | Admitting: Obstetrics and Gynecology

## 2018-06-13 ENCOUNTER — Other Ambulatory Visit: Payer: Self-pay | Admitting: Family Medicine

## 2018-06-13 ENCOUNTER — Other Ambulatory Visit: Payer: Self-pay | Admitting: Gastroenterology

## 2018-06-13 ENCOUNTER — Other Ambulatory Visit (HOSPITAL_COMMUNITY): Payer: Self-pay | Admitting: Family Medicine

## 2018-06-13 DIAGNOSIS — J359 Chronic disease of tonsils and adenoids, unspecified: Secondary | ICD-10-CM

## 2018-06-13 DIAGNOSIS — L089 Local infection of the skin and subcutaneous tissue, unspecified: Secondary | ICD-10-CM

## 2018-06-20 ENCOUNTER — Ambulatory Visit
Admission: RE | Admit: 2018-06-20 | Discharge: 2018-06-20 | Disposition: A | Discharge status: Home / Self Care | Payer: BLUE CROSS/BLUE SHIELD | Source: Ambulatory Visit | Attending: Family Medicine | Admitting: Family Medicine

## 2018-06-20 DIAGNOSIS — J359 Chronic disease of tonsils and adenoids, unspecified: Secondary | ICD-10-CM | POA: Diagnosis present

## 2018-06-20 DIAGNOSIS — J351 Hypertrophy of tonsils: Secondary | ICD-10-CM | POA: Diagnosis not present

## 2018-06-20 DIAGNOSIS — L089 Local infection of the skin and subcutaneous tissue, unspecified: Secondary | ICD-10-CM | POA: Insufficient documentation

## 2018-06-20 DIAGNOSIS — J439 Emphysema, unspecified: Secondary | ICD-10-CM | POA: Diagnosis not present

## 2018-06-20 MED ORDER — IOPAMIDOL (ISOVUE-300) INJECTION 61%
75.0000 mL | Freq: Once | INTRAVENOUS | Status: AC | PRN
  Administered 2018-06-20: 75 mL via INTRAVENOUS

## 2018-07-22 ENCOUNTER — Other Ambulatory Visit: Payer: Self-pay | Admitting: Internal Medicine

## 2018-07-22 DIAGNOSIS — R3129 Other microscopic hematuria: Secondary | ICD-10-CM

## 2018-07-29 ENCOUNTER — Ambulatory Visit: Payer: BLUE CROSS/BLUE SHIELD

## 2018-08-05 ENCOUNTER — Ambulatory Visit
Admission: RE | Admit: 2018-08-05 | Discharge: 2018-08-05 | Disposition: A | Discharge status: Home / Self Care | Payer: BLUE CROSS/BLUE SHIELD | Source: Ambulatory Visit | Attending: Internal Medicine | Admitting: Internal Medicine

## 2018-08-05 DIAGNOSIS — R3129 Other microscopic hematuria: Secondary | ICD-10-CM | POA: Diagnosis not present

## 2018-08-05 MED ORDER — IOPAMIDOL (ISOVUE-300) INJECTION 61%
125.0000 mL | Freq: Once | INTRAVENOUS | Status: AC | PRN
  Administered 2018-08-05: 150 mL via INTRAVENOUS

## 2018-08-05 MED ORDER — IOPAMIDOL (ISOVUE-300) INJECTION 61%: 100.0000 mL | Freq: Once | INTRAVENOUS | Status: DC | PRN

## 2019-08-16 ENCOUNTER — Ambulatory Visit: Payer: Self-pay | Admitting: Obstetrics and Gynecology

## 2019-10-30 ENCOUNTER — Other Ambulatory Visit: Payer: Self-pay

## 2019-10-30 ENCOUNTER — Encounter: Payer: Self-pay | Admitting: Emergency Medicine

## 2019-10-30 ENCOUNTER — Emergency Department
Admission: EM | Admit: 2019-10-30 | Discharge: 2019-10-30 | Disposition: A | Discharge status: Home / Self Care | Payer: Self-pay | Attending: Emergency Medicine | Admitting: Emergency Medicine

## 2019-10-30 ENCOUNTER — Emergency Department: Payer: Self-pay

## 2019-10-30 DIAGNOSIS — M545 Low back pain, unspecified: Secondary | ICD-10-CM

## 2019-10-30 DIAGNOSIS — F17210 Nicotine dependence, cigarettes, uncomplicated: Secondary | ICD-10-CM | POA: Insufficient documentation

## 2019-10-30 DIAGNOSIS — R109 Unspecified abdominal pain: Secondary | ICD-10-CM | POA: Insufficient documentation

## 2019-10-30 DIAGNOSIS — Z79899 Other long term (current) drug therapy: Secondary | ICD-10-CM | POA: Insufficient documentation

## 2019-10-30 LAB — COMPREHENSIVE METABOLIC PANEL
ALT: 11 U/L (ref 0–44)
AST: 16 U/L (ref 15–41)
Albumin: 4.1 g/dL (ref 3.5–5.0)
Alkaline Phosphatase: 60 U/L (ref 38–126)
Anion gap: 11 (ref 5–15)
BUN: 13 mg/dL (ref 6–20)
CO2: 25 mmol/L (ref 22–32)
Calcium: 8.8 mg/dL — ABNORMAL LOW (ref 8.9–10.3)
Chloride: 105 mmol/L (ref 98–111)
Creatinine, Ser: 0.71 mg/dL (ref 0.44–1.00)
GFR calc Af Amer: >60 mL/min (ref >60)
GFR calc non Af Amer: >60 mL/min (ref >60)
Glucose, Bld: 81 mg/dL (ref 70–99)
Potassium: 3.4 mmol/L — ABNORMAL LOW (ref 3.5–5.1)
Sodium: 141 mmol/L (ref 135–145)
Total Bilirubin: 0.7 mg/dL (ref 0.3–1.2)
Total Protein: 6.8 g/dL (ref 6.5–8.1)

## 2019-10-30 LAB — POCT PREGNANCY, URINE: Preg Test, Ur: NEGATIVE

## 2019-10-30 LAB — CBC
HCT: 39.5 % (ref 36.0–46.0)
Hemoglobin: 13.3 g/dL (ref 12.0–15.0)
MCH: 32.6 pg (ref 26.0–34.0)
MCHC: 33.7 g/dL (ref 30.0–36.0)
MCV: 96.8 fL (ref 80.0–100.0)
Platelets: 248 10*3/uL (ref 150–400)
RBC: 4.08 MIL/uL (ref 3.87–5.11)
RDW: 11.8 % (ref 11.5–15.5)
WBC: 8 10*3/uL (ref 4.0–10.5)
nRBC: 0 % (ref 0.0–0.2)

## 2019-10-30 LAB — URINALYSIS, COMPLETE (UACMP) WITH MICROSCOPIC
Bilirubin Urine: NEGATIVE
Glucose, UA: NEGATIVE mg/dL
Ketones, ur: NEGATIVE mg/dL
Leukocytes,Ua: NEGATIVE
Nitrite: NEGATIVE
Protein, ur: NEGATIVE mg/dL
Specific Gravity, Urine: 1.004 — ABNORMAL LOW (ref 1.005–1.030)
pH: 6 (ref 5.0–8.0)

## 2019-10-30 MED ORDER — CYCLOBENZAPRINE HCL 10 MG PO TABS: 10.0000 mg | ORAL_TABLET | Freq: Three times a day (TID) | ORAL | 0 refills | Status: DC | PRN

## 2019-10-30 MED ORDER — OXYCODONE-ACETAMINOPHEN 5-325 MG PO TABS: 1.0000 | ORAL_TABLET | ORAL | 0 refills | Status: AC | PRN

## 2019-10-30 MED ORDER — ONDANSETRON HCL 4 MG/2ML IJ SOLN
4.0000 mg | Freq: Once | INTRAMUSCULAR | Status: AC
  Administered 2019-10-30: 14:00:00 4 mg via INTRAVENOUS
  Filled 2019-10-30: qty 2

## 2019-10-30 MED ORDER — TRAMADOL HCL 50 MG PO TABS
50.0000 mg | ORAL_TABLET | Freq: Once | ORAL | Status: AC
  Administered 2019-10-30: 50 mg via ORAL
  Filled 2019-10-30: qty 1

## 2019-10-30 MED ORDER — MORPHINE SULFATE (PF) 4 MG/ML IV SOLN
4.0000 mg | Freq: Once | INTRAVENOUS | Status: AC
  Administered 2019-10-30: 4 mg via INTRAVENOUS
  Filled 2019-10-30: qty 1

## 2019-10-30 MED ORDER — CYCLOBENZAPRINE HCL 10 MG PO TABS
10.0000 mg | ORAL_TABLET | Freq: Once | ORAL | Status: AC
  Administered 2019-10-30: 13:00:00 10 mg via ORAL
  Filled 2019-10-30: qty 1

## 2019-10-30 MED ORDER — IOHEXOL 300 MG/ML  SOLN
100.0000 mL | Freq: Once | INTRAMUSCULAR | Status: AC | PRN
  Administered 2019-10-30: 100 mL via INTRAVENOUS
  Filled 2019-10-30: qty 100

## 2019-10-30 MED ORDER — PREDNISONE 10 MG (21) PO TBPK: ORAL_TABLET | ORAL | 0 refills | Status: DC

## 2019-10-30 NOTE — ED Provider Notes (Signed)
[facility] Emergency Department Provider Note  ____________________________________________   First MD Initiated Contact with Patient 10/30/19 1229     (approximate)  I have reviewed the triage vital signs and the nursing notes.   HISTORY  Chief Complaint Back Pain and Flank Pain    HPI Candice Le is a 43 y.o. female presents emergency department complaining of left lower back pain.  Pain does not radiate to the legs.  History of a severe kidney infection after she left a UTI go too long.  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  She is concerned about the kidney.  She denies any fever or chills.  No abdominal pain.    Past Medical History:  Diagnosis Date  . Medical history non-contributory     Patient Active Problem List   Diagnosis Date Noted  . History of Papanicolaou smear of cervix 10/14/2017    Past Surgical History:  Procedure Laterality Date  . ABDOMINAL SURGERY    . TUBAL LIGATION      Prior to Admission medications   Medication Sig Start Date End Date Taking? Authorizing Provider  cyclobenzaprine (FLEXERIL) 10 MG tablet Take 1 tablet (10 mg total) by mouth 3 (three) times daily as needed. 10/30/19   Nachman Sundt, Linden Dolin, PA-C  norethindrone (AYGESTIN) 5 MG tablet Take 1 tablet (5 mg total) by mouth daily. 10/13/17   Malachy Mood, MD  oxyCODONE-acetaminophen (PERCOCET) 5-325 MG tablet Take 1 tablet by mouth every 4 (four) hours as needed for severe pain. 10/30/19 10/29/20  Arlet Marter, Linden Dolin, PA-C  predniSONE (STERAPRED UNI-PAK 21 TAB) 10 MG (21) TBPK tablet Take 6 pills on day one then decrease by 1 pill each day 10/30/19   Versie Starks, PA-C  zolpidem Lorrin Mais) 10 MG tablet  09/23/17   [provider]    Allergies Other, Pork-derived products, and Sulfa antibiotics  Family History  Problem Relation Age of Onset  . Hypertension Mother   . Breast  cancer Paternal Aunt 35    Social History Social History   Tobacco Use  . Smoking status: Current Every Day Smoker  . Smokeless tobacco: Never Used  Substance Use Topics  . Alcohol use: No  . Drug use: No    Review of Systems  Constitutional: No fever/chills Eyes: No visual changes. ENT: No sore throat. Respiratory: Denies cough Gastrointestinal: Positive for left-sided flank pain Genitourinary: Negative for dysuria. Musculoskeletal: Negative for back pain. Skin: Negative for rash.    ____________________________________________   PHYSICAL EXAM:  VITAL SIGNS: ED Triage Vitals  Enc Vitals Group     BP 10/30/19 1037 123/78     Pulse Rate 10/30/19 1037 69     Resp 10/30/19 1037 19     Temp 10/30/19 1037 98.9 F (37.2 C)     Temp Source 10/30/19 1037 Oral     SpO2 10/30/19 1037 98 %     Weight 10/30/19 1038 150 lb (68 kg)     Height 10/30/19 1038 5\' 8"  (1.727 m)     Head Circumference --      Peak Flow --      Pain Score 10/30/19 1041 8     Pain Loc --      Pain Edu? --      Excl. in [place]? --     Constitutional: Alert and oriented. Well appearing and in no acute distress. Eyes: Conjunctivae are normal.  Head: Atraumatic. Nose: No congestion/rhinnorhea. Mouth/Throat: Mucous membranes are moist.   Neck:  supple no lymphadenopathy noted Cardiovascular: Normal rate, regular rhythm. Heart sounds are normal Respiratory: Normal respiratory effort.  No retractions, lungs c t a  Abd: soft nontender bs normal all 4 quad GU: deferred Musculoskeletal: FROM all extremities, warm and well perfused Neurologic:  Normal speech and language.  Skin:  Skin is warm, dry and intact. No rash noted. Psychiatric: Mood and affect are normal. Speech and behavior are normal.  ____________________________________________   LABS (all labs ordered are listed, but only abnormal results are displayed)  Labs Reviewed  URINALYSIS, COMPLETE (UACMP) WITH MICROSCOPIC - Abnormal; Notable  for the following components:      Result Value   Color, Urine COLORLESS (*)    APPearance CLEAR (*)    Specific Gravity, Urine 1.004 (*)    Hgb urine dipstick MODERATE (*)    Bacteria, UA RARE (*)    All other components within normal limits  COMPREHENSIVE METABOLIC PANEL - Abnormal; Notable for the following components:   Potassium 3.4 (*)    Calcium 8.8 (*)    All other components within normal limits  CBC  POC URINE PREG, ED  POCT PREGNANCY, URINE   ____________________________________________   ____________________________________________  RADIOLOGY  Ultrasound the kidney shows a hypoechogenic mass on the left radiologist is recommending CT abdomen/pelvis with IV contrast  CT abdomen/pelvis with IV contrast is negative  ____________________________________________   PROCEDURES  Procedure(s) performed: Tramadol 1 p.o., Flexeril 10 mg p.o., morphine 4 mg IV, Zofran 4 mg IV  Procedures    ____________________________________________   INITIAL IMPRESSION / ASSESSMENT AND PLAN / ED COURSE  Pertinent labs & imaging results that were available during my care of the patient were reviewed by me and considered in my medical decision making (see chart for details).   Patient is 43 year old female presents to the emergency department complaining of left-sided flank pain.  See HPI  Physical exam shows patient to appear well.  Abdomen is not very tender.  UA has a moderate amount of hemoglobin in the urine, with rare bacteria, comprehensive metabolic panel is basically normal, CBC is normal, POC pregnancy is negative  Explained the findings to the patient.  Ultrasound the kidneys did show a hypoechogenic lesion.  Radiologist is recommending CT abdomen/pelvis with IV contrast.  Explained findings to the patient.  She had been given tramadol and Flexeril.  She states no pain relief with this medication.  Since the nurse was having to insert an IV for the CT we gave her  morphine 4 mg IV along with Zofran 4 mg IV.  CT abdomen/pelvis is negative for any abnormalities. Explained the findings to the patient.  Explained her she is just have a low back pain due to musculoskeletal strain.  She was given a prescription for Sterapred, Flexeril, Percocet.  She is to follow-up with [facility] orthopedics if not improving in 1 week.  Return emergency department worsening.  She states she understands will comply.  She is discharged in stable condition.   Ryeisha Cespedes Doutt was evaluated in Emergency Department on 10/30/2019 for the symptoms described in the history of present illness. She was evaluated in the context of the global COVID-19 pandemic, which necessitated consideration that the patient might be at risk for infection with the SARS-CoV-2 virus that causes COVID-19. Institutional protocols and algorithms that pertain to the evaluation of patients at risk for COVID-19 are in a state of rapid change based  on information released by regulatory bodies including the CDC and federal and state organizations. These policies and algorithms were followed during the patient's care in the ED.   As part of my medical decision making, I reviewed the following data within the [place] notes reviewed and incorporated, Labs reviewed , Old chart reviewed, Radiograph reviewed , Discussed with radiologist, Notes from prior ED visits and [place] Controlled Substance Database  ____________________________________________   FINAL CLINICAL IMPRESSION(S) / ED DIAGNOSES  Final diagnoses:  Acute left-sided low back pain without sciatica  Flank pain      NEW MEDICATIONS STARTED DURING THIS VISIT:  New Prescriptions   CYCLOBENZAPRINE (FLEXERIL) 10 MG TABLET    Take 1 tablet (10 mg total) by mouth 3 (three) times daily as needed.   OXYCODONE-ACETAMINOPHEN (PERCOCET) 5-325 MG TABLET    Take 1 tablet by mouth every 4 (four) hours as needed for severe pain.   PREDNISONE  (STERAPRED UNI-PAK 21 TAB) 10 MG (21) TBPK TABLET    Take 6 pills on day one then decrease by 1 pill each day     Note:  This document was prepared using Dragon voice recognition software and may include unintentional dictation errors.    Versie Starks, PA-C 10/30/19 1521    Harvest Dark, MD 10/30/19 2026

## 2019-10-30 NOTE — ED Notes (Signed)
When asked if pt was driving herself she states ,"Yes, but I am good to drive". This RN explained that pt could not drive herself due to receiving IV narcotics today. Pt then states "I do have a ride then" and winks at me. I advised pt that she would have to call someone to pick her up and I would have to see her get into the car with that person to ensure safety of herself and others. Pt given phone to call ride

## 2019-10-30 NOTE — Discharge Instructions (Addendum)
[facility] with [facility] if not better in 1 week.  Return emergency department worsening.  Take medications as prescribed.  Apply wet heat to the lower back.  Do not use a dry heating pad as this may make it worse.

## 2019-10-30 NOTE — ED Triage Notes (Signed)
Pt reports left lower back pain that radiates to her left flank since last week. Denies urinary sx's.

## 2019-10-30 NOTE — ED Notes (Signed)
POC was NEGATIVE.

## 2019-10-30 NOTE — ED Notes (Signed)
See triage note  Presents with left lower back pain  States pain is not radiating   Sates she went out to truck and lifted something heavy  Ambulates well

## 2021-01-20 ENCOUNTER — Other Ambulatory Visit: Payer: Self-pay

## 2021-01-20 ENCOUNTER — Encounter (HOSPITAL_COMMUNITY): Payer: Self-pay | Admitting: *Deleted

## 2021-01-20 ENCOUNTER — Emergency Department (HOSPITAL_COMMUNITY)
Admission: EM | Admit: 2021-01-20 | Discharge: 2021-01-20 | Disposition: A | Discharge status: Home / Self Care | Payer: Self-pay | Attending: Emergency Medicine | Admitting: Emergency Medicine

## 2021-01-20 ENCOUNTER — Emergency Department (HOSPITAL_COMMUNITY): Payer: Self-pay

## 2021-01-20 DIAGNOSIS — R002 Palpitations: Secondary | ICD-10-CM | POA: Insufficient documentation

## 2021-01-20 DIAGNOSIS — Z5321 Procedure and treatment not carried out due to patient leaving prior to being seen by health care provider: Secondary | ICD-10-CM | POA: Insufficient documentation

## 2021-01-20 DIAGNOSIS — R110 Nausea: Secondary | ICD-10-CM | POA: Insufficient documentation

## 2021-01-20 LAB — BASIC METABOLIC PANEL
Anion gap: 12 (ref 5–15)
BUN: 8 mg/dL (ref 6–20)
CO2: 23 mmol/L (ref 22–32)
Calcium: 9.6 mg/dL (ref 8.9–10.3)
Chloride: 103 mmol/L (ref 98–111)
Creatinine, Ser: 0.71 mg/dL (ref 0.44–1.00)
GFR, Estimated: >60 mL/min (ref >60)
Glucose, Bld: 106 mg/dL — ABNORMAL HIGH (ref 70–99)
Potassium: 3.7 mmol/L (ref 3.5–5.1)
Sodium: 138 mmol/L (ref 135–145)

## 2021-01-20 LAB — CBC
HCT: 40 % (ref 36.0–46.0)
Hemoglobin: 13.6 g/dL (ref 12.0–15.0)
MCH: 32.6 pg (ref 26.0–34.0)
MCHC: 34 g/dL (ref 30.0–36.0)
MCV: 95.9 fL (ref 80.0–100.0)
Platelets: 280 10*3/uL (ref 150–400)
RBC: 4.17 MIL/uL (ref 3.87–5.11)
RDW: 11.9 % (ref 11.5–15.5)
WBC: 7.2 10*3/uL (ref 4.0–10.5)
nRBC: 0 % (ref 0.0–0.2)

## 2021-01-20 LAB — I-STAT BETA HCG BLOOD, ED (MC, WL, AP ONLY): I-stat hCG, quantitative: <5 m[IU]/mL (ref <5)

## 2021-01-20 LAB — TROPONIN I (HIGH SENSITIVITY): Troponin I (High Sensitivity): 3 ng/L (ref <18)

## 2021-01-20 NOTE — ED Notes (Signed)
Called for vitals recheck wit no response and called by registration with no response as well

## 2021-01-20 NOTE — ED Notes (Signed)
Called for repeat troponin, no response in lobby

## 2021-01-20 NOTE — ED Triage Notes (Signed)
Pt reports having palpitations while driving today, felt like her heart was racing after drinking two cups of coffee. Has hx of anxiety and felt like it was possible panic attack. Felt like HR was racing and nauseated. HR was 83 on ems arrival.

## 2021-03-30 ENCOUNTER — Emergency Department: Payer: Self-pay

## 2021-03-30 ENCOUNTER — Emergency Department
Admission: EM | Admit: 2021-03-30 | Discharge: 2021-03-30 | Disposition: A | Discharge status: Home / Self Care | Payer: Self-pay | Attending: Emergency Medicine | Admitting: Emergency Medicine

## 2021-03-30 ENCOUNTER — Other Ambulatory Visit: Payer: Self-pay

## 2021-03-30 DIAGNOSIS — W1839XA Other fall on same level, initial encounter: Secondary | ICD-10-CM | POA: Insufficient documentation

## 2021-03-30 DIAGNOSIS — F17200 Nicotine dependence, unspecified, uncomplicated: Secondary | ICD-10-CM | POA: Insufficient documentation

## 2021-03-30 DIAGNOSIS — Y9341 Activity, dancing: Secondary | ICD-10-CM | POA: Insufficient documentation

## 2021-03-30 DIAGNOSIS — W19XXXA Unspecified fall, initial encounter: Secondary | ICD-10-CM

## 2021-03-30 DIAGNOSIS — S2232XA Fracture of one rib, left side, initial encounter for closed fracture: Secondary | ICD-10-CM | POA: Insufficient documentation

## 2021-03-30 MED ORDER — IBUPROFEN 600 MG PO TABS: 600.0000 mg | ORAL_TABLET | Freq: Three times a day (TID) | ORAL | 0 refills | Status: DC | PRN

## 2021-03-30 MED ORDER — CYCLOBENZAPRINE HCL 10 MG PO TABS
5.0000 mg | ORAL_TABLET | Freq: Once | ORAL | Status: AC
  Administered 2021-03-30: 5 mg via ORAL
  Filled 2021-03-30: qty 1

## 2021-03-30 MED ORDER — IBUPROFEN 800 MG PO TABS
800.0000 mg | ORAL_TABLET | Freq: Once | ORAL | Status: AC
  Administered 2021-03-30: 800 mg via ORAL
  Filled 2021-03-30: qty 1

## 2021-03-30 MED ORDER — CYCLOBENZAPRINE HCL 5 MG PO TABS: 5.0000 mg | ORAL_TABLET | Freq: Three times a day (TID) | ORAL | 0 refills | Status: DC | PRN

## 2021-03-30 NOTE — ED Provider Notes (Signed)
[facility] Emergency Department Provider Note ____________________________________________  Time seen: 0930  I have reviewed the triage vital signs and the nursing notes.  HISTORY  Chief Complaint  Rib Injury   HPI Candice Le is a 45 y.o. female presents to the ER today with complaint of left posterior rib pain.  She reports this started about 12:30 AM after she fell on a log, landing on her left side.  She describes the pain as sharp and stabbing.  The pain is worse with taking a deep breath or with movement.  She has not taken any medication OTC for this.  Past Medical History:  Diagnosis Date  . Medical history non-contributory     Patient Active Problem List   Diagnosis Date Noted  . History of Papanicolaou smear of cervix 10/14/2017    Past Surgical History:  Procedure Laterality Date  . ABDOMINAL SURGERY    . TUBAL LIGATION      Prior to Admission medications   Medication Sig Start Date End Date Taking? Authorizing Provider  cyclobenzaprine (FLEXERIL) 5 MG tablet Take 1 tablet (5 mg total) by mouth 3 (three) times daily as needed for muscle spasms. 03/30/21  Yes Stashia Sia, Coralie Keens, NP  ibuprofen (ADVIL) 600 MG tablet Take 1 tablet (600 mg total) by mouth every 8 (eight) hours as needed. 03/30/21  Yes Josee Speece, Coralie Keens, NP  norethindrone (AYGESTIN) 5 MG tablet Take 1 tablet (5 mg total) by mouth daily. 10/13/17   Malachy Mood, MD  zolpidem (AMBIEN) 10 MG tablet  09/23/17   [provider]    Allergies Other, Pork-derived products, and Sulfa antibiotics  Family History  Problem Relation Age of Onset  . Hypertension Mother   . Breast cancer Paternal Aunt 14    Social History Social History   Tobacco Use  . Smoking status: Current Every Day Smoker  . Smokeless tobacco: Never Used  Vaping Use  . Vaping Use: Never used  Substance Use Topics  . Alcohol use: No  . Drug use: No    Review of Systems  Constitutional:  Negative for fever, chills or body aches. Cardiovascular: Negative for chest pain or chest tightness. Respiratory: Positive for pain with deep inspiration.  Negative for shortness of breath. Gastrointestinal: Negative for abdominal pain.,  Blood in her stool. Genitourinary: Negative for blood in her urine. Musculoskeletal: Positive for left-sided rib pain.  Negative for back or hip pain. Skin: Positive for abrasion. Neurological: Negative for tingling or numbness. ____________________________________________  PHYSICAL EXAM:  VITAL SIGNS: ED Triage Vitals [03/30/21 0709]  Enc Vitals Group     BP (!) 142/76     Pulse Rate 84     Resp      Temp 98.5 F (36.9 C)     Temp Source Oral     SpO2 99 %     Weight 165 lb (74.8 kg)     Height 5\' 8"  (1.727 m)     Head Circumference      Peak Flow      Pain Score 9     Pain Loc      Pain Edu?      Excl. in [place]?     Constitutional: Alert and oriented.  Appears in pain but in no distress. Head: Normocephalic and atraumatic. Eyes: Normal extraocular movements Cardiovascular: Normal rate, regular rhythm. Respiratory: Normal respiratory effort. No wheezes/rales/rhonchi. Musculoskeletal: Normal flexion, extension and rotation of the spine but slow, secondary to pain.  Pain with palpation  over the left posterior seventh rib. Neurologic:  Normal speech and language. No gross focal neurologic deficits are appreciated. Skin: Abrasion noted over the left flank. ____________________________________________  RADIOLOGY  Imaging Orders     DG Ribs Unilateral W/Chest Left ADDENDUM: On further review, there is a minimal displaced fracture of the left lateral eighth rib. ____________________________________________   INITIAL IMPRESSION / ASSESSMENT AND PLAN / ED COURSE  Left Side Rib Pain s/p Fall:  DDx include rib contusion versus rib fracture Initial x-ray read as normal with no acute findings Upon re-read, minimally displaced eighth rib  fracture on the left Ibuprofen 800 mg p.o. x1 Flexeril 5 mg p.o. x1 Advised her that this would take 4 to 6 weeks to heal, to avoid heavy lifting, pushing or pulling Encouraged deep breathing for prevention of pneumonia Advised her to splint the left side of her ribs when coughing or sneezing Rx for Ibuprofen 600 mg p.o. 3 times daily as needed-kidney function reviewed, normal Rx for Flexeril 5 mg 3 times daily as needed Advised her to follow-up with her PCP if symptoms persist or worsen  ____________________________________________  FINAL CLINICAL IMPRESSION(S) / ED DIAGNOSES  Final diagnoses:  Closed fracture of one rib of left side, initial encounter  Fall, initial encounter      Jearld Fenton, NP 03/30/21 1004    Blake Divine, MD 03/30/21 1540

## 2021-03-30 NOTE — ED Notes (Signed)
See triage note  Presents s/p fall  States she was dancing and fell over some logs  Landed on left ribs  Increased pain with inspiration and movement

## 2021-03-30 NOTE — ED Triage Notes (Signed)
Pt states she was dancing around last night and fell landing on a log, pt c/o left rib pain.

## 2021-03-30 NOTE — Discharge Instructions (Addendum)
You were seen today for left-sided rib pain.  Your x-ray was consistent with minimally displaced rib fracture.  I am given you prescription for anti-inflammatories and muscle relaxers to take.  Please avoid taking any other anti-inflammatories OTC such as Advil, naproxen, Motrin or Aleve.  Please be aware that the muscle relaxer will cause sedation.  We recommend ice, rest, avoiding heavy lifting, pushing, or pulling.  We recommend that you take deep breaths multiple times an hour to prevent development of pneumonia.  Follow-up with your PCP if symptoms persist or worsen.

## 2021-07-08 DIAGNOSIS — R0989 Other specified symptoms and signs involving the circulatory and respiratory systems: Secondary | ICD-10-CM | POA: Insufficient documentation

## 2021-09-08 ENCOUNTER — Ambulatory Visit (INDEPENDENT_AMBULATORY_CARE_PROVIDER_SITE_OTHER): Payer: Self-pay | Admitting: Family Medicine

## 2021-09-08 ENCOUNTER — Other Ambulatory Visit: Payer: Self-pay

## 2021-09-08 ENCOUNTER — Encounter: Payer: Self-pay | Admitting: Family Medicine

## 2021-09-08 VITALS — BP 128/83 | HR 78 | Wt 162.0 lb

## 2021-09-08 DIAGNOSIS — Z0000 Encounter for general adult medical examination without abnormal findings: Secondary | ICD-10-CM

## 2021-09-08 DIAGNOSIS — F419 Anxiety disorder, unspecified: Secondary | ICD-10-CM

## 2021-09-08 DIAGNOSIS — G4700 Insomnia, unspecified: Secondary | ICD-10-CM

## 2021-09-08 DIAGNOSIS — K219 Gastro-esophageal reflux disease without esophagitis: Secondary | ICD-10-CM

## 2021-09-08 DIAGNOSIS — A6000 Herpesviral infection of urogenital system, unspecified: Secondary | ICD-10-CM

## 2021-09-08 MED ORDER — SERTRALINE HCL 25 MG PO TABS: 25.0000 mg | ORAL_TABLET | Freq: Every day | ORAL | 0 refills | Status: DC

## 2021-09-08 NOTE — Progress Notes (Signed)
    SUBJECTIVE:   CHIEF COMPLAINT / HPI:   Candice Le is a 45 yo who presents to establish care. Previously seen by Holley Raring fields PA  PMH Broke ribs (5) from a fall several months ago and still having some pain   6/29 had viral attack and quit smoking. Received 2 steroid shots and feels she has gained weight   Anxiety- Endorses palpitations throught the day worsened sicne she broke her ribs. Does have history of panic attacks with SOB, hand and feet tingling. Last one about 4 months ago. Denies syncopal episodes. Denies symptoms of depression.  She is not currently on medication for anxiety, had previously taken Zoloft many years ago. Interested in restarting. Denies symptoms of mania or history of bipolar disorder   Genital herpes- on Valacyclover 500mg  once day. Takes intermittently. No recent outbreak   Acid reflux: occur intermittently throughout the day Feels like a hand is wrapping around throat  No difficulty swallowing foods  Being outside makes it worse  Not triggered by certain foods.  Reflux medication helps take the edge off   Menstrual cycles occur monthly Experiencing vaginal dryness and vaginal odor. Tried rephresh with minimal relief  Tobacco use-quit 06/11/2009. 20 years with 1 ppd  Denies recreational drugs, does endorse alcohol use- about 4 or 5 smirnoffs a night   Pap 6/23 negative   Insomnia: States she needs Ambien (10mg ) nightly in order to fall asleep. Has tried taking OTC supplements including melatonin without relief. States she has been taking Ambien for over 10 years.    OBJECTIVE:   BP 128/83   Pulse 78   Wt 162 lb (73.5 kg)   SpO2 98%   BMI 24.63 kg/m    Physical exam General: well appearing, NAD Cardiovascular: RRR, no murmurs Lungs: CTAB. Normal WOB Abdomen: soft, non-distended Skin: warm, dry. No edema Psych: mood and affect appropriate. Normal thought content   ASSESSMENT/PLAN:   No problem-specific Assessment & Plan notes  found for this encounter.   Anxiety Patient with history of anxiety without depression. No SI/HI. Not currently taking medication or going to therapy. I expressed patient would get the most out of both medication and therapy though she declines therapy. Is interested in restarting Zoloft. Does not express symptoms of mania or known bipolar disorder.  - start on low dose Zoloft - f/u in 2 weeks to check on how she is tolerating medication   Insomnia Patient unable to fall asleep without taking Ambien. Endorses occasional palpitations, though none currently today. Discussed that addressing her anxiety may help. Discussed not prescribing Ambien here at the clinic. Can further discuss sleep hygiene techniques at follow up  Genital herpes  Stable on Valacyclovir 500mg  daily. No current or recent outbreak.  - Continue medication.   Acid reflux? Intermittent neck discomfort and frequent burping. Worse outside. Has been followed by ENT who performed endoscopy recently and patient states it did not find anything. Has been taking Omeprazole 40mg . Asked patient to have records sent to [place].  - continue PPI. Can adjust medication at follow up and if needed she may want to further follow up with her ENT provider about continued discomfort despite treatment   Health maintenance Pap smear up to date. Will discuss colonoscopy at follow up, as well as  check for STIs and get labs (Hep C, HIV, RPR)    [name], [name]

## 2021-09-08 NOTE — Patient Instructions (Signed)
It was great meeting you today!  Today you came in to establish care. We are starting you on Zoloft 25mg  once daily for anxiety and can increase your dose depending on how you are tolerating your medication.   Please check-out at the front desk before leaving the clinic. I'd like to see you back in about 2 weeks to check on medication and do a pelvic exam and check for STIs, but if you need to be seen earlier than that for any new issues we're happy to fit you in, just give [place] a call!  Visit Reminders: - Stop by the pharmacy to pick up your prescriptions  - Continue to work on your healthy eating habits and incorporating exercise into your daily life.   Feel free to call with any questions or concerns at any time, at [phone].   Take care,  Dr. [name] [facility]

## 2021-09-10 NOTE — Progress Notes (Deleted)
     SUBJECTIVE:   CHIEF COMPLAINT / HPI:   Candice Le is a 45 y.o. female presents for STD testing   STD testing Pt desires STD testing.    ***     Health Maintenance Due  Topic   COVID-19 Vaccine (1)   HIV Screening    Hepatitis C Screening    PAP SMEAR-Modifier    COLONOSCOPY (Pts 45-77yrs Insurance coverage will need to be confirmed)    INFLUENZA VACCINE      PERTINENT  PMH / PSH:   OBJECTIVE:   There were no vitals taken for this visit.   General: Alert, no acute distress Cardio: Normal S1 and S2, RRR, no r/m/g Pulm: CTAB, normal work of breathing Abdomen: Bowel sounds normal. Abdomen soft and non-tender.  Extremities: No peripheral edema.  Neuro: Cranial nerves grossly intact    Pelvic Exam chaperoned by CMA Jazmin         External: normal female genitalia without lesions or masses        Vagina: normal without lesions or masses        Cervix: normal without lesions or masses        Pap smear: performed        Samples for Wet prep, GC/Chlamydia obtained   ASSESSMENT/PLAN:   No problem-specific Assessment & Plan notes found for this encounter.    Lattie Haw, MD PGY-3 [place]

## 2021-09-11 ENCOUNTER — Ambulatory Visit: Payer: Self-pay | Admitting: Family Medicine

## 2021-09-26 ENCOUNTER — Ambulatory Visit (INDEPENDENT_AMBULATORY_CARE_PROVIDER_SITE_OTHER): Payer: Self-pay | Admitting: Family Medicine

## 2021-09-26 ENCOUNTER — Other Ambulatory Visit: Payer: Self-pay

## 2021-09-26 ENCOUNTER — Other Ambulatory Visit (HOSPITAL_COMMUNITY)
Admission: RE | Admit: 2021-09-26 | Discharge: 2021-09-26 | Disposition: A | Discharge status: Home / Self Care | Payer: Self-pay | Source: Ambulatory Visit | Attending: Family Medicine | Admitting: Family Medicine

## 2021-09-26 VITALS — BP 120/80 | HR 80 | Ht 69.0 in | Wt 165.2 lb

## 2021-09-26 DIAGNOSIS — Z113 Encounter for screening for infections with a predominantly sexual mode of transmission: Secondary | ICD-10-CM

## 2021-09-26 DIAGNOSIS — N898 Other specified noninflammatory disorders of vagina: Secondary | ICD-10-CM

## 2021-09-26 DIAGNOSIS — Z124 Encounter for screening for malignant neoplasm of cervix: Secondary | ICD-10-CM

## 2021-09-26 LAB — POCT WET PREP (WET MOUNT)
Clue Cells Wet Prep Whiff POC: NEGATIVE
Trichomonas Wet Prep HPF POC: ABSENT

## 2021-09-26 NOTE — Patient Instructions (Signed)
Thank you for coming in today.  I will notify you of your test via MyChart unless I have to call you for any positive results. Schedule your annual physical today.  Dr. Janus Molder

## 2021-09-26 NOTE — Progress Notes (Addendum)
    SUBJECTIVE:   CHIEF COMPLAINT / HPI:   Candice Le is a 45 year old female who presents for the reasons below.  Pap Smear Last Pap smear 4 years prior with NILM.  Prior paps: Noted to be normal No LMP recorded. Contraception: Tubal ligation Sexually Active: Yes Desire for STD Screening: Yes Concerns: Vaginal odor.  Recently treated for yeast infection.  Denies bleeding in between periods.  States that recently over the last month her period has become irregular.  PERTINENT  PMH / PSH: As above.  OBJECTIVE:   BP 120/80   Pulse 80   Ht 5\' 9"  (1.753 m)   Wt 165 lb 3.2 oz (74.9 kg)   SpO2 99%   BMI 24.40 kg/m   General: Appears well, no acute distress. Age appropriate. Respiratory: normal effort Pelvic exam: normal external genitalia, vulva, vagina, cervix, uterus and adnexa.   ASSESSMENT/PLAN:   1. Encounter for Pap smear of cervix with HPV DNA cotesting - Cytology - PAP([place])  2. Screening examination for sexually transmitted disease - Cervicovaginal ancillary only - POCT Wet Prep [facility])  3. Vaginal odor - Cytology - PAP([place]) - Cervicovaginal ancillary only - POCT Wet Prep [facility])  Of note patient asking for postmenopausal testing she has only had 1 month of irregular periods discussed following up if this continues for further work-up for secondary amenorrhea.  Gerlene Fee, [place]

## 2021-09-29 ENCOUNTER — Ambulatory Visit: Payer: Self-pay

## 2021-09-29 LAB — CERVICOVAGINAL ANCILLARY ONLY
Chlamydia: NEGATIVE
Comment: NEGATIVE
Comment: NORMAL
Neisseria Gonorrhea: NEGATIVE

## 2021-09-30 LAB — CYTOLOGY - PAP
Comment: NEGATIVE
Diagnosis: NEGATIVE
High risk HPV: NEGATIVE

## 2021-10-19 NOTE — Progress Notes (Signed)
SUBJECTIVE:   Chief compliant/HPI: annual examination  Candice Le is a 45 y.o. who presents today for an annual exam.   History tabs reviewed and updated.   Review of systems form reviewed and notable for excessive alcohol intake (daily), and concern for weight gain (recently stopped smoking).   OBJECTIVE:   BP 132/75   Pulse 68   Ht 5\' 9"  (1.753 m)   Wt 168 lb (76.2 kg)   LMP 10/19/2021   SpO2 100%   BMI 24.81 kg/m   PHQ9 SCORE ONLY 10/20/2021  PHQ-9 Total Score 10   Physical Exam Vitals reviewed.  Constitutional:      General: She is not in acute distress. HENT:     Head: Normocephalic.     Right Ear: External ear normal.     Left Ear: External ear normal.     Nose: Nose normal.  Eyes:     Conjunctiva/sclera: Conjunctivae normal.  Cardiovascular:     Rate and Rhythm: Normal rate and regular rhythm.     Pulses: Normal pulses.     Heart sounds: Normal heart sounds.  Pulmonary:     Effort: Pulmonary effort is normal.     Breath sounds: Normal breath sounds.  Abdominal:     General: Abdomen is flat.     Palpations: Abdomen is soft.     Tenderness: There is no abdominal tenderness. There is no guarding or rebound.  Musculoskeletal:     Cervical back: Neck supple.     Right lower leg: No edema.     Left lower leg: No edema.  Lymphadenopathy:     Cervical: No cervical adenopathy.  Neurological:     Mental Status: She is alert and oriented to person, place, and time.  Psychiatric:        Mood and Affect: Mood normal.        Behavior: Behavior normal.   ASSESSMENT/PLAN:   Gastroesophageal reflux disease, unspecified whether esophagitis present Chronic. Takes PPI which helps for 3 hours and then feels as if something is hold her throat. Denies esophageal surgeries, intubation, or issues with breathing. Has history of daily alcohol intake and I suspect this is contributory. Discussed GI referral in addition to colonoscopy. Patient agreeable to see  specialist.  - Ambulatory referral to Gastroenterology  Other fatigue Excessive tired feeling and weight gain. Denies B symptoms. Reviewed flowsheets 15lbs weight gain from 11/162020-03/30/2021. Over last few months weight has been stable. Will follow up labs below to assess if further evaluation is warranted.  - TSH - Hemoglobin  Excessive alcohol intake Appears behaviorally appropriate today. Daily alcohol intake. States it is not a problem. Declined resources. Needs revision with subsequent visits.    Annual Examination  See AVS for age appropriate recommendations.   PHQ score 10, reviewed and discussed. Taking zoloft as prescribed.  Blood pressure reviewed and at goal.  Asked about intimate partner violence and resources given as appropriate  The patient currently uses BTL for contraception.  Considered the following items based upon USPSTF recommendations: Diabetes screening:  not ordered Screening for elevated cholesterol:  not ordered at this visit but consider ordering in the future. HIV testing: discussed and ordered Hepatitis C: discussed and ordered Hepatitis B: discussed and not ordered Syphilis if at high risk: discussed and not ordered GC/CT not at high risk and not ordered. Reviewed risk factors for latent tuberculosis and not indicated  Discussed family history, BRCA testing not indicated.  Cervical cancer screening: prior Pap  reviewed, repeat due in 2025 Breast cancer screening:  hx of simple cyst; last mammo reviewed 2017. Appropriate to start screening at 57 yrs of age.  Colorectal cancer screening: discussed, colonoscopy ordered if age 2 or over.   Follow up in 1 year or sooner if indicated.    Gerlene Fee, [place]

## 2021-10-20 ENCOUNTER — Encounter: Payer: Self-pay | Admitting: Family Medicine

## 2021-10-20 ENCOUNTER — Other Ambulatory Visit: Payer: Self-pay

## 2021-10-20 ENCOUNTER — Ambulatory Visit (INDEPENDENT_AMBULATORY_CARE_PROVIDER_SITE_OTHER): Payer: Self-pay | Admitting: Family Medicine

## 2021-10-20 VITALS — BP 132/75 | HR 68 | Ht 69.0 in | Wt 168.0 lb

## 2021-10-20 DIAGNOSIS — K219 Gastro-esophageal reflux disease without esophagitis: Secondary | ICD-10-CM

## 2021-10-20 DIAGNOSIS — Z01419 Encounter for gynecological examination (general) (routine) without abnormal findings: Secondary | ICD-10-CM

## 2021-10-20 DIAGNOSIS — R5383 Other fatigue: Secondary | ICD-10-CM

## 2021-10-20 DIAGNOSIS — Z1159 Encounter for screening for other viral diseases: Secondary | ICD-10-CM

## 2021-10-20 DIAGNOSIS — Z1211 Encounter for screening for malignant neoplasm of colon: Secondary | ICD-10-CM

## 2021-10-20 DIAGNOSIS — Z7141 Alcohol abuse counseling and surveillance of alcoholic: Secondary | ICD-10-CM

## 2021-10-20 LAB — POCT HEMOGLOBIN: Hemoglobin: 13.1 g/dL (ref 11–14.6)

## 2021-10-20 NOTE — Patient Instructions (Addendum)
Thank you for coming in today. We will get labs and I will communicate via mychart or letter. Follow up with GI for acid reflux and colonoscopy. As discussed consider unscented soaps to see if vaginal odor improves.   Dr. Janus Molder

## 2021-10-21 LAB — HCV AB W REFLEX TO QUANT PCR: HCV Ab: <0.1 s/co ratio (ref 0.0–0.9)

## 2021-10-21 LAB — HIV ANTIBODY (ROUTINE TESTING W REFLEX): HIV Screen 4th Generation wRfx: NONREACTIVE

## 2021-10-21 LAB — TSH: TSH: 1.06 u[IU]/mL (ref 0.450–4.500)

## 2021-10-21 LAB — HCV INTERPRETATION

## 2021-10-22 ENCOUNTER — Encounter: Payer: Self-pay | Admitting: Family Medicine

## 2021-10-24 ENCOUNTER — Other Ambulatory Visit: Payer: Self-pay

## 2021-10-29 ENCOUNTER — Encounter: Payer: Self-pay | Admitting: Nurse Practitioner

## 2021-10-29 ENCOUNTER — Ambulatory Visit: Payer: Self-pay | Admitting: Nurse Practitioner

## 2021-10-29 VITALS — BP 142/98 | HR 66 | Ht 69.0 in | Wt 167.0 lb

## 2021-10-29 DIAGNOSIS — J0510 Acute epiglottitis without obstruction: Secondary | ICD-10-CM

## 2021-10-29 DIAGNOSIS — K219 Gastro-esophageal reflux disease without esophagitis: Secondary | ICD-10-CM

## 2021-10-29 DIAGNOSIS — Z1211 Encounter for screening for malignant neoplasm of colon: Secondary | ICD-10-CM

## 2021-10-29 NOTE — Patient Instructions (Addendum)
PROCEDURES: You have been scheduled for an EGD and Colonoscopy. Please follow the written instructions given to you at your visit today. Please pick up your prep supplies at the pharmacy within the next 1-3 days. If you use inhalers (even only as needed), please bring them with you on the day of your procedure.  RECOMMENDATIONS: Follow up with ENT. Continue Omeprazole 40 MG twice a day. Contact us if your symptoms worsen. Go to the emergency department if severe throat tightening occurs. GERD diet.  It was great seeing you today! Thank you for entrusting me with your care and choosing [facility].  Noralyn Pick, CRNP  Gastroesophageal Reflux Disease, Adult Gastroesophageal reflux (GER) happens when acid from the stomach flows up into the tube that connects the mouth and the stomach (esophagus). Normally, food travels down the esophagus and stays in the stomach to be digested. With GER, food and stomach acid sometimes move back up into the esophagus. You may have a disease called gastroesophageal reflux disease (GERD) if the reflux: Happens often. Causes frequent or very bad symptoms. Causes problems such as damage to the esophagus. When this happens, the esophagus becomes sore and swollen. Over time, GERD can make small holes (ulcers) in the lining of the esophagus. What are the causes? This condition is caused by a problem with the muscle between the esophagus and the stomach. When this muscle is weak or not normal, it does not close properly to keep food and acid from coming back up from the stomach. The muscle can be weak because of: Tobacco use. Pregnancy. Having a certain type of hernia (hiatal hernia). Alcohol use. Certain foods and drinks, such as coffee, chocolate, onions, and peppermint. What increases the risk? Being overweight. Having a disease that affects your connective tissue. Taking NSAIDs, such a ibuprofen. What are the signs or  symptoms? Heartburn. Difficult or painful swallowing. The feeling of having a lump in the throat. A bitter taste in the mouth. Bad breath. Having a lot of saliva. Having an upset or bloated stomach. Burping. Chest pain. Different conditions can cause chest pain. Make sure you see your doctor if you have chest pain. Shortness of breath or wheezing. A long-term cough or a cough at night. Wearing away of the surface of teeth (tooth enamel). Weight loss. How is this treated? Making changes to your diet. Taking medicine. Having surgery. Treatment will depend on how bad your symptoms are. Follow these instructions at home: Eating and drinking  Follow a diet as told by your doctor. You may need to avoid foods and drinks such as: Coffee and tea, with or without caffeine. Drinks that contain alcohol. Energy drinks and sports drinks. Bubbly (carbonated) drinks or sodas. Chocolate and cocoa. Peppermint and mint flavorings. Garlic and onions. Horseradish. Spicy and acidic foods. These include peppers, chili powder, curry powder, vinegar, hot sauces, and BBQ sauce. Citrus fruit juices and citrus fruits, such as oranges, lemons, and limes. Tomato-based foods. These include red sauce, chili, salsa, and pizza with red sauce. Fried and fatty foods. These include donuts, french fries, potato chips, and high-fat dressings. High-fat meats. These include hot dogs, rib eye steak, sausage, ham, and bacon. High-fat dairy items, such as whole milk, butter, and cream cheese. Eat small meals often. Avoid eating large meals. Avoid drinking large amounts of liquid with your meals. Avoid eating meals during the 2-3 hours before bedtime. Avoid lying down right after you eat. Do not exercise right after you eat. Lifestyle  Do  not smoke or use any products that contain nicotine or tobacco. If you need help quitting, ask your doctor. Try to lower your stress. If you need help doing this, ask your  doctor. If you are overweight, lose an amount of weight that is healthy for you. Ask your doctor about a safe weight loss goal. General instructions Pay attention to any changes in your symptoms. Take over-the-counter and prescription medicines only as told by your doctor. Do not take aspirin, ibuprofen, or other NSAIDs unless your doctor says it is okay. Wear loose clothes. Do not wear anything tight around your waist. Raise (elevate) the head of your bed about 6 inches (15 cm). You may need to use a wedge to do this. Avoid bending over if this makes your symptoms worse. Keep all follow-up visits. Contact a doctor if: You have new symptoms. You lose weight and you do not know why. You have trouble swallowing or it hurts to swallow. You have wheezing or a cough that keeps happening. You have a hoarse voice. Your symptoms do not get better with treatment. Get help right away if: You have sudden pain in your arms, neck, jaw, teeth, or back. You suddenly feel sweaty, dizzy, or light-headed. You have chest pain or shortness of breath. You vomit and the vomit is green, yellow, or black, or it looks like blood or coffee grounds. You faint. Your poop (stool) is red, bloody, or black. You cannot swallow, drink, or eat. These symptoms may represent a serious problem that is an emergency. Do not wait to see if the symptoms will go away. Get medical help right away. Call your local emergency services (911 in the [place] Do not drive yourself to the hospital. Summary If a person has gastroesophageal reflux disease (GERD), food and stomach acid move back up into the esophagus and cause symptoms or problems such as damage to the esophagus. Treatment will depend on how bad your symptoms are. Follow a diet as told by your doctor. Take all medicines only as told by your doctor. This information is not intended to replace advice given to you by your health care provider. Make sure you discuss any  questions you have with your health care provider. Document Revised: 06/10/2020 Document Reviewed: 06/10/2020 Elsevier Patient Education  2022 [place] providers would like to encourage you to use [facility] to communicate with providers for non-urgent requests or questions.  Due to long hold times on the telephone, sending your provider a message by [facility] may be faster and more efficient way to get a response. Please allow 48 business hours for a response.  Please remember that this is for non-urgent requests/questions. If you are age 23 or older, your body mass index should be between 23-30. Your Body mass index is 24.66 kg/m. If this is out of the aforementioned range listed, please consider follow up with your Primary Care Provider.  If you are age 3 or younger, your body mass index should be between 19-25. Your Body mass index is 24.66 kg/m. If this is out of the aformentioned range listed, please consider follow up with your Primary Care Provider.

## 2021-10-29 NOTE — Progress Notes (Addendum)
10/29/2021 [name] [id number] 03/31/1976   CHIEF COMPLAINT: GERD, schedule a colonoscopy   HISTORY OF PRESENT ILLNESS: Candice Le is a 45 year old female with a past medical history of a left rib fracture 03/2021, Meckel's diverticulum s/p laparoscopic closure enterostomy 2009, GERD and epiglottitis. Past tubal ligation.  She presents to our office today as referred by Dr. Sherren Mocha McDiarmid for further evaluation regarding GERD symptoms and to schedule a screening colonoscopy.  She presented to [facility] ED on 06/09/2021 due to have severe throat soreness. A strep teste was negative. Covid 19 and influenza A and B were negative. CT scan of the neck showed moderate swelling and possible small abscess formation of the epiglottis. White blood count was 10.9. Pneumococcal epiglottitis was suspected. She was prescribed Augmentin liquid 400mg  bid x 10 days and Prelone 30mg  po x 7 days which she completed as prescribed. She was seen by ENT 07/08/2021 and a laryngoscopy done which showed normal nasal and nasopharyngeal mucosa, supraglottic larynx was completely normal, hypopharynx was normal with no pooling of secretions and her vocal cords appeared normal and moved well with phonation. She received a Celestone 6mg  injection and Omeprazole 40mg  QD for residual inflammation and possible reflux. She received a 2nd Celestone injection on 07/17/2021.  She developed LUQ pain with pain under the left breast so she went to the ED on 10/15/2021. Her LUQ and left chest pain were assessed to be associated with her recent left rib fracture and GERD and noncardiac. EKG without evidence of ischemia. Troponin levels and D dimer were normal. Lipase and LFTs were normal. WBC 8.5. Hg 39.4. CXR was negative. She received Protonix IV and Simethicone and her LUQ and chest pain abated without recurrence.   She continues to have burning throat pain with throat tightening. She feels like someone has a choke  hold over her throat at all times which gets tighter after she eats any food or drinks any liquids. Her lips tingle at time without any swelling. No facial swelling. No stridor or SOB. Food does not get stuck when she swallows. She continues follow up with ENT who increased her Omeprazole dose 40mg  to bid one week ago without any significant improvement.   She has a history of IBS. She has intermittent nonbloody diarrhea which is typically triggered by eating fatty foods. She passes a normal brown formed stool daily when she eats a healthy diet.   CBC Latest Ref Rng & Units 10/20/2021 01/20/2021 10/30/2019  WBC 4.0 - 10.5 K/uL - 7.2 8.0  Hemoglobin 11 - 14.6 g/dL 13.1 13.6 13.3  Hematocrit 36.0 - 46.0 % - 40.0 39.5  Platelets 150 - 400 K/uL - 280 248    CMP Latest Ref Rng & Units 01/20/2021 10/30/2019 07/13/2012  Glucose 70 - 99 mg/dL 106(H) 81 108(H)  BUN 6 - 20 mg/dL 8 13 7   Creatinine 0.44 - 1.00 mg/dL 0.71 0.71 0.80  Sodium 135 - 145 mmol/L 138 141 141  Potassium 3.5 - 5.1 mmol/L 3.7 3.4(L) 4.0  Chloride 98 - 111 mmol/L 103 105 104  CO2 22 - 32 mmol/L 23 25 -  Calcium 8.9 - 10.3 mg/dL 9.6 8.8(L) -  Total Protein 6.5 - 8.1 g/dL - 6.8 -  Total Bilirubin 0.3 - 1.2 mg/dL - 0.7 -  Alkaline Phos 38 - 126 U/L - 60 -  AST 15 - 41 U/L - 16 -  ALT 0 - 44 U/L - 11 -  Past Medical History:  Diagnosis Date   Medical history non-contributory    Past Surgical History:  Procedure Laterality Date   ABDOMINAL SURGERY     TUBAL LIGATION    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 She denies any problems with sedation/anesthesia or airway management/intubation during any past procedure or surgery.   Social History: Past smoker. She drinks 2 to 3 mixed drinks on the weekends. No drug use.   Family History: Mother with history of hypertension.  Paternal aunt with history of breast cancer. No known family history of IBD, esophageal, gastric or colon cancer.   Allergies  Allergen  Reactions   Other     Lettuce, inflammation   Pork-Derived Products     Dizzy,ringing ears,hot flash   Sulfa Antibiotics Other (See Comments)    "makes me feel like I'm dying."     Outpatient Encounter Medications as of 10/29/2021  Medication Sig   cyclobenzaprine (FLEXERIL) 5 MG tablet Take 1 tablet (5 mg total) by mouth 3 (three) times daily as needed for muscle spasms.   escitalopram (LEXAPRO) 5 MG tablet Take 5 mg by mouth daily.   ibuprofen (ADVIL) 600 MG tablet Take 1 tablet (600 mg total) by mouth every 8 (eight) hours as needed.   omeprazole (PRILOSEC) 40 MG capsule Take 40 mg by mouth 2 (two) times daily.   sertraline (ZOLOFT) 25 MG tablet Take 1 tablet (25 mg total) by mouth daily.   zolpidem (AMBIEN) 10 MG tablet    No facility-administered encounter medications on file as of 10/29/2021.   REVIEW OF SYSTEMS:  Gen: Denies fever, sweats or chills. No weight loss.  CV: Denies chest pain, palpitations or edema. Resp: Denies cough, shortness of breath of hemoptysis.  GI:See HPI.  GU : Denies urinary burning, blood in urine, increased urinary frequency or incontinence. MS: + Back pain and arthritis.  Derm: Denies rash, itchiness, skin lesions or unhealing ulcers. Psych: Denies depression, anxiety or memory loss.  Heme: Denies bruising, bleeding. Neuro:  Denies headaches, dizziness or paresthesias. Endo:  Denies any problems with DM, thyroid or adrenal function.  PHYSICAL EXAM: Ht 5\' 9"  (1.753 m)   Wt 167 lb (75.8 kg)   LMP 10/19/2021   BMI 24.66 kg/m  BP (!) 142/98   Pulse 66   Ht 5\' 9"  (1.753 m)   Wt 167 lb (75.8 kg)   LMP 10/19/2021   SpO2 98%   BMI 24.66 kg/m   General: 45 year old female in NAD.  Head: Normocephalic and atraumatic. Eyes:  Sclerae non-icteric, conjunctive pink. Ears: Normal auditory acuity. Mouth: Dentition intact. No ulcers or lesions. No thrush.  Neck: Supple, no lymphadenopathy or thyromegaly.  Lungs: Clear bilaterally to auscultation  without wheezes, crackles or rhonchi. Heart: Regular rate and rhythm. No murmur, rub or gallop appreciated.  Abdomen: Soft, nontender, non distended. No masses. No hepatosplenomegaly. Normoactive bowel sounds x 4 quadrants.  Rectal: Deferred.  Musculoskeletal: Symmetrical with no gross deformities. Skin: Warm and dry. No rash or lesions on visible extremities. Extremities: No edema. Neurological: Alert oriented x 4, no focal deficits.  Psychological:  Alert and cooperative. Normal mood and affect.  ASSESSMENT AND PLAN:  10) 45 year old female treated for epiglottitis 06/09/2021 with persistent throat burning/odynophagia, possible laryngeal reflux. I suspect she had candidiasis esophagitis secondary to Augmentin and Prelone po and Celestone injections which she received for epiglottitis as noted above. Laryngoscopy 06/2021 per ENT was unrevealing.  -EGD benefits and risks discussed including risk  with sedation, risk of bleeding, perforation and infection.  -I will contact Osvaldo Angst CRNA to verify if patient is an appropriate candidate for procedure at [facility] or [facility] with history of epiglottitis.  -GERD handout -Continue Omeprazole 40mg  po  bid for now -Consider empiric treatment with Fluconazole for suspected esophageal candidiasis, to discuss further with Dr .Havery Moros  2) Colon cancer screening -Colonoscopy benefits and risks discussed including risk with sedation, risk of bleeding, perforation and infection   3) History of Meckel's diverticulum s/p laparoscopic closure enterostomy 2009  4) History of IBS -Further recommendations to be determined after colonoscopy completed   Further recommendation to be determined after the above evaluation completed  ADDENDUM: Case reviewed by Osvaldo Angst CRNP, patient cleared for procedures at [facility]       CC:  [name], DO

## 2021-10-30 NOTE — Progress Notes (Signed)
Agree with assessment as outlined. Would treat empirically for candidiasis while awaiting procedures.

## 2021-10-30 NOTE — Progress Notes (Signed)
Candice Le pls contact the patient and let her know I consulted with Dr.Armbruster and he is ok with treating her for presumed yeast in her esophagus. Pls send in RX for Diflucan 100mg  tabs: Take 2 tabs po day # 1 then one tab po once daily x 14 days. Dispense # 16, no additional refills. THX

## 2021-10-31 ENCOUNTER — Telehealth: Payer: Self-pay

## 2021-10-31 ENCOUNTER — Other Ambulatory Visit: Payer: Self-pay

## 2021-10-31 MED ORDER — FLUCONAZOLE 100 MG PO TABS: ORAL_TABLET | ORAL | 0 refills | Status: DC

## 2021-10-31 NOTE — Telephone Encounter (Signed)
Patient contacted and advised. Rx to the [facility] of her choice. No further questions from the patient.

## 2021-10-31 NOTE — Telephone Encounter (Addendum)
-----   Message from Noralyn Pick, NP sent at 10/30/2021  1:03 PM EST ----- (Nurse Practitioner)           Beth pls contact the patient and let her know I consulted with Dr.Armbruster and he is ok with treating her for presumed yeast in her esophagus. Pls send in RX for Diflucan 100mg  tabs: Take 2 tabs po day # 1 then one tab po once daily x 14 days. Dispense # 16, no additional refills. THX

## 2021-11-03 ENCOUNTER — Emergency Department (HOSPITAL_COMMUNITY): Payer: Self-pay

## 2021-11-03 ENCOUNTER — Emergency Department (HOSPITAL_COMMUNITY)
Admission: EM | Admit: 2021-11-03 | Discharge: 2021-11-04 | Disposition: A | Discharge status: Home / Self Care | Payer: Self-pay | Attending: Emergency Medicine | Admitting: Emergency Medicine

## 2021-11-03 ENCOUNTER — Other Ambulatory Visit: Payer: Self-pay

## 2021-11-03 ENCOUNTER — Encounter (HOSPITAL_COMMUNITY): Payer: Self-pay | Admitting: Emergency Medicine

## 2021-11-03 DIAGNOSIS — I10 Essential (primary) hypertension: Secondary | ICD-10-CM | POA: Insufficient documentation

## 2021-11-03 DIAGNOSIS — R110 Nausea: Secondary | ICD-10-CM | POA: Insufficient documentation

## 2021-11-03 DIAGNOSIS — R079 Chest pain, unspecified: Secondary | ICD-10-CM | POA: Insufficient documentation

## 2021-11-03 DIAGNOSIS — Z87891 Personal history of nicotine dependence: Secondary | ICD-10-CM | POA: Insufficient documentation

## 2021-11-03 DIAGNOSIS — R519 Headache, unspecified: Secondary | ICD-10-CM | POA: Insufficient documentation

## 2021-11-03 HISTORY — DX: Essential (primary) hypertension: I10

## 2021-11-03 HISTORY — DX: Anxiety disorder, unspecified: F41.9

## 2021-11-03 LAB — BASIC METABOLIC PANEL
Anion gap: 10 (ref 5–15)
BUN: 8 mg/dL (ref 6–20)
CO2: 25 mmol/L (ref 22–32)
Calcium: 9.4 mg/dL (ref 8.9–10.3)
Chloride: 104 mmol/L (ref 98–111)
Creatinine, Ser: 0.81 mg/dL (ref 0.44–1.00)
GFR, Estimated: >60 mL/min (ref >60)
Glucose, Bld: 107 mg/dL — ABNORMAL HIGH (ref 70–99)
Potassium: 3.4 mmol/L — ABNORMAL LOW (ref 3.5–5.1)
Sodium: 139 mmol/L (ref 135–145)

## 2021-11-03 LAB — CBC
HCT: 39.2 % (ref 36.0–46.0)
Hemoglobin: 12.9 g/dL (ref 12.0–15.0)
MCH: 31.2 pg (ref 26.0–34.0)
MCHC: 32.9 g/dL (ref 30.0–36.0)
MCV: 94.7 fL (ref 80.0–100.0)
Platelets: 277 10*3/uL (ref 150–400)
RBC: 4.14 MIL/uL (ref 3.87–5.11)
RDW: 11 % — ABNORMAL LOW (ref 11.5–15.5)
WBC: 7 10*3/uL (ref 4.0–10.5)
nRBC: 0 % (ref 0.0–0.2)

## 2021-11-03 LAB — TROPONIN I (HIGH SENSITIVITY): Troponin I (High Sensitivity): 3 ng/L (ref <18)

## 2021-11-03 NOTE — ED Provider Notes (Signed)
Emergency Medicine Provider Triage Evaluation Note  Candice Le , a 45 y.o. female  was evaluated in triage.  Pt complains of hypertension. Does not take medicine for hypertension, was elevated to the 170/100 at home.  She felt nauseated, did not vomit.  Denies any abdominal pain, called EMS due to concerns about the blood pressure.  She did have a headache it came on suddenly and is constant.  Reports she is also having intermittent blurry vision to the right eye.  Chest pain started in ambulance, its right side and does not radiate.  Review of Systems  Positive: Headache, chest pain Negative: Syncope  Physical Exam  BP (!) 177/90   Pulse 81   Temp 98.2 F (36.8 C) (Oral)   Resp 18   LMP 10/19/2021   SpO2 99%  Gen:   Awake, no distress   Resp:  Normal effort  MSK:   Moves extremities without difficulty  Other:  Lungs clear to auscultation, radial pulse 2+ equal bilaterally.  S1-S2  Medical Decision Making  Medically screening exam initiated at 9:31 PM.  Appropriate orders placed.  Candice Le was informed that the remainder of the evaluation will be completed by another provider, this initial triage assessment does not replace that evaluation, and the importance of remaining in the ED until their evaluation is complete.  Chest pain work-up, will add CT given hypertension headache and blurry vision   Sherrill Raring, Hershal Coria 11/03/21 2133    Regan Lemming, MD 11/03/21 2251

## 2021-11-03 NOTE — ED Triage Notes (Signed)
Patient [place] with [place] [facility] [place] , patient went to the station to have her blood pressure checked BP=180/84 , she added left chest pain this evening , non radiating with nausea and headache .

## 2021-11-04 LAB — TROPONIN I (HIGH SENSITIVITY): Troponin I (High Sensitivity): 5 ng/L (ref <18)

## 2021-11-04 MED ORDER — KETOROLAC TROMETHAMINE 15 MG/ML IJ SOLN
15.0000 mg | Freq: Once | INTRAMUSCULAR | Status: AC
  Administered 2021-11-04: 15 mg via INTRAVENOUS
  Filled 2021-11-04: qty 1

## 2021-11-04 MED ORDER — DIPHENHYDRAMINE HCL 50 MG/ML IJ SOLN
25.0000 mg | Freq: Once | INTRAMUSCULAR | Status: AC
  Administered 2021-11-04: 25 mg via INTRAVENOUS
  Filled 2021-11-04: qty 1

## 2021-11-04 MED ORDER — METOCLOPRAMIDE HCL 5 MG/ML IJ SOLN
10.0000 mg | Freq: Once | INTRAMUSCULAR | Status: AC
  Administered 2021-11-04: 10 mg via INTRAVENOUS
  Filled 2021-11-04: qty 2

## 2021-11-04 MED ORDER — MAGNESIUM SULFATE IN D5W 1-5 GM/100ML-% IV SOLN
1.0000 g | Freq: Once | INTRAVENOUS | Status: DC
  Administered 2021-11-04: 1 g via INTRAVENOUS
  Filled 2021-11-04: qty 100

## 2021-11-04 MED ORDER — POTASSIUM CHLORIDE 20 MEQ PO PACK
40.0000 meq | PACK | Freq: Once | ORAL | Status: AC
  Administered 2021-11-04: 40 meq via ORAL
  Filled 2021-11-04: qty 2

## 2021-11-04 MED ORDER — LACTATED RINGERS IV BOLUS
1000.0000 mL | Freq: Once | INTRAVENOUS | Status: AC
  Administered 2021-11-04: 1000 mL via INTRAVENOUS

## 2021-11-04 NOTE — Discharge Instructions (Signed)
Follow-up with your primary care doctor to discuss concerns of blood pressure

## 2021-11-04 NOTE — ED Provider Notes (Signed)
[facility] EMERGENCY DEPARTMENT Provider Note   CSN: [id number] Arrival date & time: 11/03/21  2125     History Chief Complaint  Patient presents with   Hypertension / Chest Pain Candice Le    Candice Le is a 45 y.o. female.  HPI Patient presents for hypertension.  Blood pressure measurements at home were elevated in the range of 170/100.  She had transient nausea.  She describes new headache and intermittent blurry vision to the right eye.  Additionally, during transit to the hospital, she did experience chest pain chest pain is described as right sided and nonradiating.  Since arrival in the ED waiting room, she has had resolution of her chest pain.  She continues to have a headache which described as posterior and right-sided.  She does have history of migraine headaches.  She feels like her current headache may be a developing migraine headache.  She denies any current blurry vision.  She has had intermittent elevated blood pressure since she quit smoking in June.  She has discussed this with her primary care doctor but has not been started on the antihypertensive medications.  Additional medical history includes some recent esophagitis.  She is followed by GI for this.She has been started on omeprazole as well as nystatin with subsequent improvement.   HPI: A 45 year old patient with a history of hypertension presents for evaluation of chest pain. Initial onset of pain was approximately 3-6 hours ago. The patient's chest pain is sharp and is not worse with exertion. The patient's chest pain is not middle- or left-sided, is not well-localized, is not described as heaviness/pressure/tightness and does not radiate to the arms/jaw/neck. The patient does not complain of nausea and denies diaphoresis. The patient has no history of stroke, has no history of peripheral artery disease, has not smoked in the past 90 days, denies any history of treated diabetes, has no relevant  family history of coronary artery disease (first degree relative at less than age 67), has no history of hypercholesterolemia and does not have an elevated BMI (>=30).   Past Medical History:  Diagnosis Date   Anxiety    GERD (gastroesophageal reflux disease)    Hypertension    Insomnia     Patient Active Problem List   Diagnosis Date Noted   Throat tightness 07/08/2021   History of Papanicolaou smear of cervix 10/14/2017    Past Surgical History:  Procedure Laterality Date   ABDOMINAL SURGERY  2012   dr gross   TUBAL LIGATION       OB History     Gravida  4   Para  4   Term      Preterm      AB      Living  4      SAB      IAB      Ectopic      Multiple      Live Births  4           Family History  Problem Relation Age of Onset   Hypertension Mother    Breast cancer Paternal Aunt 77   Colon cancer Neg Hx    Stomach cancer Neg Hx    Esophageal cancer Neg Hx    Pancreatic cancer Neg Hx     Social History   Tobacco Use   Smoking status: Former    Types: Cigarettes   Smokeless tobacco: Never  Vaping Use   Vaping Use: Never used  Substance Use Topics   Alcohol use: Yes    Comment: Daily   Drug use: No    Home Medications Prior to Admission medications   Medication Sig Start Date End Date Taking? Authorizing Provider  fluconazole (DIFLUCAN) 100 MG tablet Take 2 tablets by mouth today then 1 tablet by mouth every day until gone 10/31/21   Noralyn Pick, NP  omeprazole (PRILOSEC) 40 MG capsule Take 40 mg by mouth 2 (two) times daily. 10/23/21 11/22/21  [provider]  zolpidem (AMBIEN) 10 MG tablet  09/23/17   [provider]    Allergies    Other, Pork-derived products, and Sulfa antibiotics  Review of Systems   Review of Systems  Constitutional:  Negative for activity change, chills and fever.  HENT:  Negative for congestion, ear pain, rhinorrhea and sore throat.   Eyes:  Positive for visual  disturbance. Negative for photophobia and pain.  Respiratory:  Negative for cough, chest tightness and shortness of breath.   Cardiovascular:  Positive for chest pain. Negative for palpitations.  Gastrointestinal:  Positive for nausea. Negative for abdominal distention, abdominal pain, diarrhea and vomiting.  Genitourinary:  Negative for dysuria, flank pain, hematuria and pelvic pain.  Musculoskeletal:  Negative for arthralgias, back pain, gait problem, joint swelling and myalgias.  Skin:  Negative for color change and rash.  Neurological:  Positive for headaches. Negative for dizziness, seizures, syncope, speech difficulty, weakness, light-headedness and numbness.  Hematological:  Does not bruise/bleed easily.  Psychiatric/Behavioral:  Negative for confusion and decreased concentration.   All other systems reviewed and are negative.  Physical Exam Updated Vital Signs BP (!) 151/96   Pulse (!) 59   Temp 98.4 F (36.9 C)   Resp 12   LMP 10/19/2021   SpO2 100%   Physical Exam Vitals and nursing note reviewed.  Constitutional:      General: She is not in acute distress.    Appearance: Normal appearance. She is well-developed and normal weight. She is not ill-appearing, toxic-appearing or diaphoretic.  HENT:     Head: Normocephalic and atraumatic.     Right Ear: External ear normal.     Left Ear: External ear normal.     Nose: Nose normal.     Mouth/Throat:     Mouth: Mucous membranes are moist.     Pharynx: Oropharynx is clear.  Eyes:     Extraocular Movements: Extraocular movements intact.     Conjunctiva/sclera: Conjunctivae normal.  Cardiovascular:     Rate and Rhythm: Normal rate and regular rhythm.     Heart sounds: No murmur heard.   No friction rub.  Pulmonary:     Effort: Pulmonary effort is normal. No respiratory distress.     Breath sounds: Normal breath sounds. No wheezing or rales.  Chest:     Chest wall: No tenderness.  Abdominal:     Palpations: Abdomen is  soft.     Tenderness: There is no abdominal tenderness.  Musculoskeletal:        General: No swelling.     Cervical back: Normal range of motion and neck supple. No rigidity or tenderness.     Right lower leg: No edema.     Left lower leg: No edema.  Skin:    General: Skin is warm and dry.     Capillary Refill: Capillary refill takes less than 2 seconds.     Coloration: Skin is not jaundiced or pale.  Neurological:     General: No focal  deficit present.     Mental Status: She is alert and oriented to person, place, and time.     Cranial Nerves: No cranial nerve deficit.     Sensory: No sensory deficit.     Motor: No weakness.     Coordination: Coordination normal.  Psychiatric:        Mood and Affect: Mood normal.        Behavior: Behavior normal.        Thought Content: Thought content normal.        Judgment: Judgment normal.    ED Results / Procedures / Treatments   Labs (all labs ordered are listed, but only abnormal results are displayed) Labs Reviewed  BASIC METABOLIC PANEL - Abnormal; Notable for the following components:      Result Value   Potassium 3.4 (*)    Glucose, Bld 107 (*)    All other components within normal limits  CBC - Abnormal; Notable for the following components:   RDW 11.0 (*)    All other components within normal limits  TROPONIN I (HIGH SENSITIVITY)  TROPONIN I (HIGH SENSITIVITY)    EKG EKG Interpretation  Date/Time:  Monday November 03 2021 21:32:21 EST Ventricular Rate:  72 PR Interval:  124 QRS Duration: 76 QT Interval:  394 QTC Calculation: 431 R Axis:   63 Text Interpretation: Normal sinus rhythm Nonspecific ST abnormality Abnormal ECG Confirmed by Godfrey Pick [phone]) on 11/04/2021 8:06:46 AM  Radiology DG Chest 2 View  Result Date: 11/03/2021 CLINICAL DATA:  Chest pain. EXAM: CHEST - 2 VIEW COMPARISON:  03/30/2021. FINDINGS: The heart size and mediastinal contours are within normal limits. Both lungs are clear. No acute osseous  abnormality. IMPRESSION: No active cardiopulmonary disease. Electronically Signed   By: Brett Fairy M.D.   On: 11/03/2021 22:01   CT Head Wo Contrast  Result Date: 11/03/2021 CLINICAL DATA:  Headache. EXAM: CT HEAD WITHOUT CONTRAST TECHNIQUE: Contiguous axial images were obtained from the base of the skull through the vertex without intravenous contrast. COMPARISON:  Head CT dated 07/13/2012. FINDINGS: Brain: No evidence of acute infarction, hemorrhage, hydrocephalus, extra-axial collection or mass lesion/mass effect. Vascular: No hyperdense vessel or unexpected calcification. Skull: Normal. Negative for fracture or focal lesion. Sinuses/Orbits: No acute finding. Other: None IMPRESSION: Normal noncontrast CT of the brain. Electronically Signed   By: Anner Crete M.D.   On: 11/03/2021 22:39    Procedures Procedures   Medications Ordered in ED Medications  magnesium sulfate IVPB 1 g 100 mL (has no administration in time range)  lactated ringers bolus 1,000 mL (1,000 mLs Intravenous New Bag/Given 11/04/21 0846)  ketorolac (TORADOL) 15 MG/ML injection 15 mg (15 mg Intravenous Given 11/04/21 0841)  metoCLOPramide (REGLAN) injection 10 mg (10 mg Intravenous Given 11/04/21 0842)  diphenhydrAMINE (BENADRYL) injection 25 mg (25 mg Intravenous Given 11/04/21 0844)  potassium chloride (KLOR-CON) packet 40 mEq (40 mEq Oral Given 11/04/21 1478)    ED Course  I have reviewed the triage vital signs and the nursing notes.  Pertinent labs & imaging results that were available during my care of the patient were reviewed by me and considered in my medical decision making (see chart for details).    MDM Rules/Calculators/A&P HEAR Score: 2                        Patient is a healthy 45 year old female who presents for concern of elevated blood pressure.  She initially experienced a posterior  headache which prompted her to check her blood pressure.  She reports initial elevation of 180/100.  On initial  arrival in the ED, blood pressure remained elevated.  Prior to being bedded in the ED, patient was able to undergo diagnostic work-up.  Work-up showed no evidence of endorgan damage.  CT of head was normal.  During her ED waiting room time, blood pressure improved to the range of 150/80.  On initial assessment, patient reports resolution of chest pain but continued headache that is similar to prior migraine headaches.  Patient's heart score is a 2.  Patient has had 2 negative troponins.  No further cardiac work-up is indicated at this time.  Elevated blood pressure could be secondary to pain from her headache.  We will treat headache with migraine cocktail and reassess.  On reassessment, patient had resolution of headache.  Additionally, blood pressure improved to 132/77.  Patient advised to follow-up with her primary care doctor for concerns of hypertension and possibly starting antihypertensive medication.  She was discharged in good condition.  Final Clinical Impression(s) / ED Diagnoses Final diagnoses:  Nonintractable headache, unspecified chronicity pattern, unspecified headache type    Rx / DC Orders ED Discharge Orders     None        Godfrey Pick, MD 11/04/21 [phone]

## 2021-11-12 ENCOUNTER — Ambulatory Visit (INDEPENDENT_AMBULATORY_CARE_PROVIDER_SITE_OTHER): Payer: Self-pay | Admitting: Family Medicine

## 2021-11-12 ENCOUNTER — Encounter: Payer: Self-pay | Admitting: Family Medicine

## 2021-11-12 ENCOUNTER — Other Ambulatory Visit: Payer: Self-pay

## 2021-11-12 VITALS — BP 128/80 | HR 80 | Ht 69.0 in | Wt 162.2 lb

## 2021-11-12 DIAGNOSIS — F419 Anxiety disorder, unspecified: Secondary | ICD-10-CM

## 2021-11-12 DIAGNOSIS — G4709 Other insomnia: Secondary | ICD-10-CM | POA: Insufficient documentation

## 2021-11-12 DIAGNOSIS — I10 Essential (primary) hypertension: Secondary | ICD-10-CM

## 2021-11-12 MED ORDER — AMLODIPINE BESYLATE 2.5 MG PO TABS: 2.5000 mg | ORAL_TABLET | Freq: Every day | ORAL | 3 refills | Status: DC

## 2021-11-12 MED ORDER — SERTRALINE HCL 50 MG PO TABS: 50.0000 mg | ORAL_TABLET | Freq: Every day | ORAL | 3 refills | Status: DC

## 2021-11-12 NOTE — Patient Instructions (Addendum)
I have sent in your rx. Let us see you in about 3 weeks.  Please get some random BP checks.

## 2021-11-12 NOTE — Assessment & Plan Note (Signed)
Longstanding chronic use of Ambien.  We discussed weaning off it.  I will leave it to her to decide if she wants to attempt that as we are also starting to new medicines today or she wants to continue at her current dose until we get these other medicines stabilized.  If she does want to wean off, I would break it in half for the next couple weeks.

## 2021-11-12 NOTE — Assessment & Plan Note (Signed)
From her history, I think she has had longstanding anxiety.  She also reports a history of ADHD.  Anxiety seems to be really problematic for her right now and she is worrying and ruminating.  We agreed to start sertraline 50 mg daily and follow-up in 3 weeks.

## 2021-11-12 NOTE — Assessment & Plan Note (Signed)
Review of her numbers in her chart and her home numbers shows that she has multiple elevations in the diastolic blood pressure.  We will start her on amlodipine 2.5 mg daily.  Long discussion about safety of this.  Follow-up 3 weeks.  In the interim, she will also continue to get some random blood pressure readings and record those for [place]

## 2021-11-12 NOTE — Progress Notes (Signed)
Likely   CHIEF COMPLAINT / HPI: #1.  Increased anxiety and increased heart palpitations.  States she is been anxious but in the last couple of months is gotten worse.  She worries about multiple things. #2.  Chronic Ambien: She says she has been taking it for 22 years but would like to wean off it.  Says it leaves her with a hangover feeling in the morning. #3.  Brings blood pressure readings from home with her.  Several of the diastolics are in the mid to high 90s.  Systolics are usually normal.  Also was seen in emergency room recently and had quite elevated blood pressure there that was associated with a migraine. #4.  Stopped smoking in June.   PERTINENT  PMH / PSH: I have reviewed the patient's medications, allergies, past medical and surgical history, smoking status and updated in the EMR as appropriate.   OBJECTIVE:  BP 128/80   Pulse 80   Ht 5\' 9"  (1.753 m)   Wt 162 lb 4 oz (73.6 kg)   LMP 10/19/2021   SpO2 99%   BMI 23.96 kg/m  GENERAL: Well-developed female no acute distress CV: Regular rate and rhythm PSYCH: AxOx4. Good eye contact.. No psychomotor retardation or agitation. Appropriate speech fluency and content. Asks and answers questions appropriately. Mood is congruent.   ASSESSMENT / PLAN:   Other insomnia Longstanding chronic use of Ambien.  We discussed weaning off it.  I will leave it to her to decide if she wants to attempt that as we are also starting to new medicines today or she wants to continue at her current dose until we get these other medicines stabilized.  If she does want to wean off, I would break it in half for the next couple weeks.  Anxiety From her history, I think she has had longstanding anxiety.  She also reports a history of ADHD.  Anxiety seems to be really problematic for her right now and she is worrying and ruminating.  We agreed to start sertraline 50 mg daily and follow-up in 3 weeks.  Primary hypertension Review of her numbers in her  chart and her home numbers shows that she has multiple elevations in the diastolic blood pressure.  We will start her on amlodipine 2.5 mg daily.  Long discussion about safety of this.  Follow-up 3 weeks.  In the interim, she will also continue to get some random blood pressure readings and record those for [place]   Dorcas Mcmurray MD

## 2021-12-03 ENCOUNTER — Ambulatory Visit: Payer: Self-pay | Admitting: Family Medicine

## 2021-12-12 ENCOUNTER — Telehealth: Payer: Self-pay

## 2021-12-12 NOTE — Telephone Encounter (Signed)
Patient calls nurse line regarding continued elevated BP readings for the past two days. Patient reports readings in the 150's over 100's. Patient also reports having migraine headache. Patient does not report current headache at the time of telephone encounter. Advised patient with elevated BP and headache, UC evaluation is recommended. Patient declines UC evaluation at this time and is requesting to be scheduled for next available appointment. Scheduled with Dr. Erin Hearing on Tuesday, 1/3.   Patient is asking if she can increase her amlodipine to 5 mg (2 tablets of 2.5 mg) over the weekend. Will forward to provider who last saw patient for further advice on this matter. Reiterated UC/ED precautions over the weekend.  Also forwarding to Dr. Erin Hearing as he will see patient on Tuesday.   Talbot Grumbling, RN

## 2021-12-12 NOTE — Telephone Encounter (Signed)
Called patient. Instructed to take 5 mg once daily of amlodipine over weekend. Also reports epistaxis <5 minutes on 12/29. Discussed, return precautions given, all questions answered. Dorris Singh, MD  Family Medicine Teaching Service

## 2021-12-16 ENCOUNTER — Encounter: Payer: Self-pay | Admitting: Family Medicine

## 2021-12-16 ENCOUNTER — Ambulatory Visit (INDEPENDENT_AMBULATORY_CARE_PROVIDER_SITE_OTHER): Payer: Self-pay | Admitting: Family Medicine

## 2021-12-16 ENCOUNTER — Other Ambulatory Visit: Payer: Self-pay

## 2021-12-16 DIAGNOSIS — G4709 Other insomnia: Secondary | ICD-10-CM

## 2021-12-16 DIAGNOSIS — I10 Essential (primary) hypertension: Secondary | ICD-10-CM

## 2021-12-16 DIAGNOSIS — F419 Anxiety disorder, unspecified: Secondary | ICD-10-CM

## 2021-12-16 LAB — POCT URINALYSIS DIP (MANUAL ENTRY)
Bilirubin, UA: NEGATIVE
Glucose, UA: NEGATIVE mg/dL
Ketones, POC UA: NEGATIVE mg/dL
Leukocytes, UA: NEGATIVE
Nitrite, UA: NEGATIVE
Protein Ur, POC: NEGATIVE mg/dL
Spec Grav, UA: 1.015 (ref 1.010–1.025)
Urobilinogen, UA: 0.2 E.U./dL
pH, UA: 7 (ref 5.0–8.0)

## 2021-12-16 LAB — POCT UA - MICROSCOPIC ONLY

## 2021-12-16 MED ORDER — AMLODIPINE BESYLATE 5 MG PO TABS: 5.0000 mg | ORAL_TABLET | Freq: Every day | ORAL | 3 refills | Status: DC

## 2021-12-16 MED ORDER — VALACYCLOVIR HCL 500 MG PO TABS: 500.0000 mg | ORAL_TABLET | Freq: Two times a day (BID) | ORAL | 1 refills | Status: DC

## 2021-12-16 NOTE — Assessment & Plan Note (Signed)
Improved with life changes.  No longer on medication.

## 2021-12-16 NOTE — Assessment & Plan Note (Signed)
Improved with increased amlodipine.  Had normal bmet.  Will check UA for completeness.  See after visit summary for monitoring and follow up

## 2021-12-16 NOTE — Progress Notes (Signed)
° ° °  SUBJECTIVE:   CHIEF COMPLAINT / HPI:   Anxiety She changed her job and broke up with a relationship and feels much better.  Took the sertraline for only 2 doses.  Does not feel is currently a problem  Primary hypertension Checked her blood pressure several times when either had a headache or stress and had diastolic in 010U.  Was increased to Amlodipine 5 mg daily and has been better.  No visual changes or edema  Other insomnia Continues to use 10 mg most nights sometimes 5 mg. Has used for many years  Wants to come off.   PERTINENT  PMH / PSH: insomnia anxiety hypertension   OBJECTIVE:   BP 133/77    Pulse 73    Wt 164 lb 9.6 oz (74.7 kg)    SpO2 98%    BMI 24.31 kg/m   No edema Neck:  No deformities, thyromegaly, masses, or tenderness noted.   Supple with full range of motion without pain.   ASSESSMENT/PLAN:   Primary hypertension Improved with increased amlodipine.  Had normal bmet.  Will check UA for completeness.  See after visit summary for monitoring and follow up   Anxiety Improved with life changes.  No longer on medication.   Other insomnia Continues daily ambien use.  Discussed weaning very slowly      Lind Covert, MD [place]

## 2021-12-16 NOTE — Assessment & Plan Note (Signed)
Continues daily [place] use.  Discussed weaning very slowly

## 2021-12-16 NOTE — Patient Instructions (Signed)
Good to see you today - Thank you for coming in  Things we discussed today:  Your goal blood pressure is less than 140/90.  Check your blood pressure several times a week. Write down in a list.  If regularly higher than this please let me know - either with MyChart or leaving a phone message. Next visit please bring in your blood pressure cuff.     Let you know about the urine by MyChart  See Dr Arby Barrette in 1-2 months

## 2021-12-24 ENCOUNTER — Telehealth: Payer: Self-pay | Admitting: Gastroenterology

## 2021-12-24 ENCOUNTER — Encounter: Payer: Self-pay | Admitting: Gastroenterology

## 2021-12-24 NOTE — Telephone Encounter (Signed)
Good Afternoon Dr. Havery Moros,  We called this patient at 1:45 to see if she would be coming for her  procedure she did not answer so we left a message.

## 2021-12-24 NOTE — Telephone Encounter (Signed)
Sorry to hear this, quite frustrating. She gave no advanced notice, unfortunately she needs to be charged for a NO-SHOW fee, Jamelle Haring if you can help with that. Thanks

## 2022-01-07 ENCOUNTER — Other Ambulatory Visit: Payer: Self-pay

## 2022-01-07 NOTE — Telephone Encounter (Signed)
Patient calls nurse line requesting refill on Ambien 10 mg tablets. Patient reports that she had been receiving from another provider, then established with our office and now needs refill.   Patient last discussed insomnia with Dr. Nori Riis during appointment on 11/12/2021. Patient also discussed with Dr. Erin Hearing on 12/16/21.  Will forward to PCP for further advisement.   Talbot Grumbling, RN

## 2022-04-09 ENCOUNTER — Telehealth: Payer: Self-pay | Admitting: Gastroenterology

## 2022-04-09 DIAGNOSIS — Z1211 Encounter for screening for malignant neoplasm of colon: Secondary | ICD-10-CM | POA: Diagnosis not present

## 2022-04-09 DIAGNOSIS — I10 Essential (primary) hypertension: Secondary | ICD-10-CM | POA: Diagnosis not present

## 2022-04-09 DIAGNOSIS — R42 Dizziness and giddiness: Secondary | ICD-10-CM | POA: Diagnosis not present

## 2022-04-09 NOTE — Telephone Encounter (Signed)
Good Afternoon Dr. Havery Moros,  ? ?Patient called and stated that she wanted to reschedule her Endoscopy and Colonoscopy that she missed back in January. Patient stated that she only has insurance until May 31st. Would patient need another OV? And can she be scheduled with another provider or do we need to do a transfer of care to someone who has dates before May 31st?  ? ?Please advise.  ?

## 2022-04-09 NOTE — Telephone Encounter (Signed)
ERROR

## 2022-04-09 NOTE — Telephone Encounter (Signed)
You can tell her she can schedule for her procedures with me, but I do not know if I have any openings prior to May 31.  If I do can use a spot. We can also place her on a wait list in case openings occur. ? ?Otherwise if I have no openings and one of my partners have any openings, you can ask them if they would be willing to do her case.  She no-showed the last visit without any notice, she must show up if she schedules another appointment with [place] or she will not be able to schedule again without seeing [place] in the office first.  Can you let me know what she decides? Thanks ?

## 2022-04-13 ENCOUNTER — Other Ambulatory Visit: Payer: Self-pay | Admitting: Family Medicine

## 2022-04-13 DIAGNOSIS — Z1231 Encounter for screening mammogram for malignant neoplasm of breast: Secondary | ICD-10-CM

## 2022-04-13 DIAGNOSIS — Z419 Encounter for procedure for purposes other than remedying health state, unspecified: Secondary | ICD-10-CM | POA: Diagnosis not present

## 2022-04-13 NOTE — Telephone Encounter (Signed)
Yes, as I previously noted I am perfectly fine with having my partners take care of her cases for her if she can get them done sooner with another provider given my limited availability, but she can follow-up with me after those procedures are done and I am still happy to take care of her moving forward for those exams.  Thanks much ?

## 2022-04-13 NOTE — Telephone Encounter (Signed)
Called patient to advise, patient did not answer and was unable to leave a voicemail. Will try again at a later time.  ?

## 2022-04-13 NOTE — Telephone Encounter (Signed)
Dr. Havery Moros, ? ?Patient returned my call and I advised her that you did not have anything before 5/31. Patient stated she would like to switch care to another MD that has something before then. Are you okay with her to transfer to Dr. Bryan Lemma to be able to have these procedures done? ? ?Please advise.  ?

## 2022-04-14 ENCOUNTER — Encounter: Payer: Self-pay | Admitting: Gastroenterology

## 2022-04-14 ENCOUNTER — Ambulatory Visit
Admission: RE | Admit: 2022-04-14 | Discharge: 2022-04-14 | Disposition: A | Discharge status: Home / Self Care | Payer: Medicaid Other | Source: Ambulatory Visit | Attending: Family Medicine | Admitting: Family Medicine

## 2022-04-14 DIAGNOSIS — Z1231 Encounter for screening mammogram for malignant neoplasm of breast: Secondary | ICD-10-CM | POA: Insufficient documentation

## 2022-04-14 NOTE — Telephone Encounter (Signed)
Dr. Havery Moros and Dr. Bryan Lemma, ? ?Thank you both. ? ?Patient was scheduled with Dr. Bryan Lemma on 5/30 at 2:00.  ?

## 2022-04-14 NOTE — Telephone Encounter (Signed)
Dr. Bryan Lemma, ? ?Are you okay with taking on patients care for her to have both endo and colon procedures done? ? ?Please advise. ?

## 2022-04-16 DIAGNOSIS — I10 Essential (primary) hypertension: Secondary | ICD-10-CM | POA: Diagnosis not present

## 2022-04-16 DIAGNOSIS — F419 Anxiety disorder, unspecified: Secondary | ICD-10-CM | POA: Diagnosis not present

## 2022-04-16 DIAGNOSIS — F5104 Psychophysiologic insomnia: Secondary | ICD-10-CM | POA: Diagnosis not present

## 2022-04-16 DIAGNOSIS — N644 Mastodynia: Secondary | ICD-10-CM | POA: Diagnosis not present

## 2022-04-17 ENCOUNTER — Other Ambulatory Visit: Payer: Self-pay | Admitting: Family Medicine

## 2022-04-17 DIAGNOSIS — R928 Other abnormal and inconclusive findings on diagnostic imaging of breast: Secondary | ICD-10-CM

## 2022-04-17 DIAGNOSIS — N6489 Other specified disorders of breast: Secondary | ICD-10-CM

## 2022-04-20 DIAGNOSIS — F411 Generalized anxiety disorder: Secondary | ICD-10-CM | POA: Diagnosis not present

## 2022-04-21 ENCOUNTER — Other Ambulatory Visit: Payer: Self-pay

## 2022-04-21 ENCOUNTER — Ambulatory Visit (AMBULATORY_SURGERY_CENTER): Payer: Self-pay

## 2022-04-21 VITALS — Ht 68.0 in | Wt 157.0 lb

## 2022-04-21 DIAGNOSIS — Z1211 Encounter for screening for malignant neoplasm of colon: Secondary | ICD-10-CM

## 2022-04-21 MED ORDER — PLENVU 140 G PO SOLR: 1.0000 | Freq: Once | ORAL | 0 refills | Status: AC

## 2022-04-21 NOTE — Progress Notes (Signed)
Denies allergies to eggs or soy products. Denies complication of anesthesia or sedation. Denies use of weight loss medication. Denies use of O2.  ? ?Emmi instructions given for colonoscopy.  ? ?Patient takes Xanax for anxiety. Patient is requesting stronger medication prior to her procedure preferably IV. I told the patient that we did not keep that kind of medication in this office, but explained that we will make sure that she is comfortable during the procedure. I told her that she could take Xanax prior to the procedure.  ?

## 2022-04-27 ENCOUNTER — Ambulatory Visit
Admission: RE | Admit: 2022-04-27 | Discharge: 2022-04-27 | Disposition: A | Discharge status: Home / Self Care | Payer: Medicaid Other | Source: Ambulatory Visit | Attending: Family Medicine | Admitting: Family Medicine

## 2022-04-27 DIAGNOSIS — R922 Inconclusive mammogram: Secondary | ICD-10-CM | POA: Diagnosis not present

## 2022-04-27 DIAGNOSIS — N6321 Unspecified lump in the left breast, upper outer quadrant: Secondary | ICD-10-CM | POA: Diagnosis not present

## 2022-04-27 DIAGNOSIS — N6489 Other specified disorders of breast: Secondary | ICD-10-CM | POA: Insufficient documentation

## 2022-04-27 DIAGNOSIS — R928 Other abnormal and inconclusive findings on diagnostic imaging of breast: Secondary | ICD-10-CM

## 2022-05-07 DIAGNOSIS — R002 Palpitations: Secondary | ICD-10-CM | POA: Diagnosis not present

## 2022-05-07 DIAGNOSIS — R4586 Emotional lability: Secondary | ICD-10-CM | POA: Diagnosis not present

## 2022-05-12 ENCOUNTER — Ambulatory Visit (AMBULATORY_SURGERY_CENTER): Payer: Medicaid Other | Admitting: Gastroenterology

## 2022-05-12 ENCOUNTER — Encounter: Payer: Self-pay | Admitting: Gastroenterology

## 2022-05-12 VITALS — BP 117/55 | HR 59 | Temp 97.8°F | Resp 11 | Ht 69.0 in | Wt 164.0 lb

## 2022-05-12 DIAGNOSIS — Z1211 Encounter for screening for malignant neoplasm of colon: Secondary | ICD-10-CM | POA: Diagnosis not present

## 2022-05-12 DIAGNOSIS — J0510 Acute epiglottitis without obstruction: Secondary | ICD-10-CM

## 2022-05-12 DIAGNOSIS — K5730 Diverticulosis of large intestine without perforation or abscess without bleeding: Secondary | ICD-10-CM

## 2022-05-12 DIAGNOSIS — R12 Heartburn: Secondary | ICD-10-CM

## 2022-05-12 DIAGNOSIS — K635 Polyp of colon: Secondary | ICD-10-CM | POA: Diagnosis not present

## 2022-05-12 DIAGNOSIS — D125 Benign neoplasm of sigmoid colon: Secondary | ICD-10-CM | POA: Diagnosis not present

## 2022-05-12 DIAGNOSIS — R152 Fecal urgency: Secondary | ICD-10-CM | POA: Diagnosis not present

## 2022-05-12 DIAGNOSIS — R197 Diarrhea, unspecified: Secondary | ICD-10-CM | POA: Diagnosis not present

## 2022-05-12 DIAGNOSIS — K2950 Unspecified chronic gastritis without bleeding: Secondary | ICD-10-CM | POA: Diagnosis not present

## 2022-05-12 MED ORDER — SODIUM CHLORIDE 0.9 % IV SOLN: 500.0000 mL | Freq: Once | INTRAVENOUS | Status: DC

## 2022-05-12 NOTE — Patient Instructions (Signed)
Handouts Provided:  Polyps  YOU HAD AN ENDOSCOPIC PROCEDURE TODAY AT [facility]:   Refer to the procedure report that was given to you for any specific questions about what was found during the examination.  If the procedure report does not answer your questions, please call your gastroenterologist to clarify.  If you requested that your care partner not be given the details of your procedure findings, then the procedure report has been included in a sealed envelope for you to review at your convenience later.  YOU SHOULD EXPECT: Some feelings of bloating in the abdomen. Passage of more gas than usual.  Walking can help get rid of the air that was put into your GI tract during the procedure and reduce the bloating. If you had a lower endoscopy (such as a colonoscopy or flexible sigmoidoscopy) you may notice spotting of blood in your stool or on the toilet paper. If you underwent a bowel prep for your procedure, you may not have a normal bowel movement for a few days.  Please Note:  You might notice some irritation and congestion in your nose or some drainage.  This is from the oxygen used during your procedure.  There is no need for concern and it should clear up in a day or so.  SYMPTOMS TO REPORT IMMEDIATELY:  Following lower endoscopy (colonoscopy or flexible sigmoidoscopy):  Excessive amounts of blood in the stool  Significant tenderness or worsening of abdominal pains  Swelling of the abdomen that is new, acute  Fever of 100F or higher  Following upper endoscopy (EGD)  Vomiting of blood or coffee ground material  New chest pain or pain under the shoulder blades  Painful or persistently difficult swallowing  New shortness of breath  Fever of 100F or higher  Black, tarry-looking stools  For urgent or emergent issues, a gastroenterologist can be reached at any hour by calling [phone]. Do not use MyChart messaging for urgent concerns.    DIET:  We do recommend  a small meal at first, but then you may proceed to your regular diet.  Drink plenty of fluids but you should avoid alcoholic beverages for 24 hours.  ACTIVITY:  You should plan to take it easy for the rest of today and you should NOT DRIVE or use heavy machinery until tomorrow (because of the sedation medicines used during the test).    FOLLOW UP: Our staff will call the number listed on your records 48-72 hours following your procedure to check on you and address any questions or concerns that you may have regarding the information given to you following your procedure. If we do not reach you, we will leave a message.  We will attempt to reach you two times.  During this call, we will ask if you have developed any symptoms of COVID 19. If you develop any symptoms (ie: fever, flu-like symptoms, shortness of breath, cough etc.) before then, please call [phone].  If you test positive for Covid 19 in the 2 weeks post procedure, please call and report this information to [place].    If any biopsies were taken you will be contacted by phone or by letter within the next 1-3 weeks.  Please call us at [phone] if you have not heard about the biopsies in 3 weeks.    SIGNATURES/CONFIDENTIALITY: You and/or your care partner have signed paperwork which will be entered into your electronic medical record.  These signatures attest to the fact that that the  information above on your After Visit Summary has been reviewed and is understood.  Full responsibility of the confidentiality of this discharge information lies with you and/or your care-partner.

## 2022-05-12 NOTE — Progress Notes (Signed)
Pt's states no medical or surgical changes since previsit or office visit. 

## 2022-05-12 NOTE — Progress Notes (Signed)
Pt non-responsive, VVS, Report to RN  °

## 2022-05-12 NOTE — Progress Notes (Signed)
Called to room to assist during endoscopic procedure.  Patient ID and intended procedure confirmed with present staff. Received instructions for my participation in the procedure from the performing physician.  

## 2022-05-12 NOTE — Op Note (Signed)
[facility] Patient Name: Candice Le Procedure Date: 05/12/2022 1:50 PM MRN: [id number] Endoscopist: Gerrit Heck , MD Age: 46 Referring MD:  Date of Birth: [birth year] Gender: Female Account #: [id number] Procedure:                Colonoscopy Indications:              Screening for colorectal malignant neoplasm. This                            is the patient's first colonoscopy.                           Additionally, she reports a long standing history                            of diarrhea, loose stools, and fecal urgency. Medicines:                Monitored Anesthesia Care Procedure:                Pre-Anesthesia Assessment:                           - Prior to the procedure, a History and Physical                            was performed, and patient medications and                            allergies were reviewed. The patient's tolerance of                            previous anesthesia was also reviewed. The risks                            and benefits of the procedure and the sedation                            options and risks were discussed with the patient.                            All questions were answered, and informed consent                            was obtained. Prior Anticoagulants: The patient has                            taken no previous anticoagulant or antiplatelet                            agents. ASA Grade Assessment: II - A patient with                            mild systemic disease. After reviewing the risks  and benefits, the patient was deemed in                            satisfactory condition to undergo the procedure.                           After obtaining informed consent, the colonoscope                            was passed under direct vision. Throughout the                            procedure, the patient's blood pressure, pulse, and                            oxygen saturations were  monitored continuously. The                            CF [id number] #[id number] was introduced through the anus                            and advanced to the the terminal ileum. The                            colonoscopy was performed without difficulty. The                            patient tolerated the procedure well. The quality                            of the bowel preparation was good. The terminal                            ileum, ileocecal valve, appendiceal orifice, and                            rectum were photographed. Scope In: 2:15:34 PM Scope Out: 2:29:31 PM Scope Withdrawal Time: 0 hours 11 minutes 25 seconds  Total Procedure Duration: 0 hours 13 minutes 57 seconds  Findings:                 The perianal and digital rectal examinations were                            normal.                           A 4 mm polyp was found in the distal sigmoid colon.                            The polyp was sessile. The polyp was removed with a                            cold snare. Resection and retrieval were complete.  Estimated blood loss was minimal.                           A few diverticula were found in the sigmoid colon.                           Normal mucosa was found in the entire colon.                            Biopsies for histology were taken with a cold                            forceps from the right colon and left colon for                            evaluation of microscopic colitis. Estimated blood                            loss was minimal.                           The retroflexed view of the distal rectum and anal                            verge was normal and showed no anal or rectal                            abnormalities.                           The terminal ileum appeared normal. Complications:            No immediate complications. Estimated Blood Loss:     Estimated blood loss was minimal. Impression:               - One 4 mm  polyp in the distal sigmoid colon,                            removed with a cold snare. Resected and retrieved.                           - Diverticulosis in the sigmoid colon.                           - Normal mucosa in the entire examined colon.                            Biopsied.                           - The distal rectum and anal verge are normal on                            retroflexion view.                           -  The examined portion of the ileum was normal. Recommendation:           - Patient has a contact number available for                            emergencies. The signs and symptoms of potential                            delayed complications were discussed with the                            patient. Return to normal activities tomorrow.                            Written discharge instructions were provided to the                            patient.                           - Resume previous diet.                           - Continue present medications.                           - Await pathology results.                           - Repeat colonoscopy for surveillance based on                            pathology results.                           - Return to GI clinic PRN. Gerrit Heck, MD 05/12/2022 2:43:14 PM

## 2022-05-12 NOTE — Op Note (Signed)
[facility] Patient Name: Candice Le Procedure Date: 05/12/2022 1:58 PM MRN: [id number] Endoscopist: Gerrit Heck , MD Age: 46 Referring MD:  Date of Birth: November 04, 1976 Gender: Female Account #: [id number] Procedure:                Upper GI endoscopy Indications:              Suspected esophageal reflux, Diarrhea, Fecal urgency Medicines:                Monitored Anesthesia Care Procedure:                Pre-Anesthesia Assessment:                           - Prior to the procedure, a History and Physical                            was performed, and patient medications and                            allergies were reviewed. The patient's tolerance of                            previous anesthesia was also reviewed. The risks                            and benefits of the procedure and the sedation                            options and risks were discussed with the patient.                            All questions were answered, and informed consent                            was obtained. Prior Anticoagulants: The patient has                            taken no previous anticoagulant or antiplatelet                            agents. ASA Grade Assessment: II - A patient with                            mild systemic disease. After reviewing the risks                            and benefits, the patient was deemed in                            satisfactory condition to undergo the procedure.                           After obtaining informed consent, the endoscope was  passed under direct vision. Throughout the                            procedure, the patient's blood pressure, pulse, and                            oxygen saturations were monitored continuously. The                            GIF [id number] was introduced through the                            mouth, and advanced to the second part of duodenum.                             The upper GI endoscopy was accomplished without                            difficulty. The patient tolerated the procedure                            well. Scope In: Scope Out: Findings:                 The examined esophagus was normal.                           The Z-line was regular and was found 41 cm from the                            incisors.                           The gastroesophageal flap valve was visualized                            endoscopically and classified as Hill Grade II                            (fold present, opens with respiration).                           The entire examined stomach was normal. Biopsies                            were taken with a cold forceps for Helicobacter                            pylori testing. Estimated blood loss was minimal.                           The examined duodenum was normal. Biopsies were                            taken with a cold forceps for histology. Estimated  blood loss was minimal. Complications:            No immediate complications. Estimated Blood Loss:     Estimated blood loss was minimal. Impression:               - Normal esophagus.                           - Z-line regular, 41 cm from the incisors.                           - Gastroesophageal flap valve classified as Hill                            Grade II (fold present, opens with respiration).                           - Normal stomach. Biopsied.                           - Normal examined duodenum. Biopsied. Recommendation:           - Patient has a contact number available for                            emergencies. The signs and symptoms of potential                            delayed complications were discussed with the                            patient. Return to normal activities tomorrow.                            Written discharge instructions were provided to the                            patient.                            - Resume previous diet.                           - Continue present medications.                           - Await pathology results.                           - Perform a colonoscopy today. Gerrit Heck, MD 05/12/2022 2:35:29 PM

## 2022-05-12 NOTE — Progress Notes (Signed)
GASTROENTEROLOGY PROCEDURE H&P NOTE   Primary Care Physician: Peggye Form, NP    Reason for Procedure:   GERD, Colon cancer screening  Plan:    EGD, Colonoscopy  Patient is appropriate for endoscopic procedure(s) in the ambulatory ([facility]) setting.  The nature of the procedure, as well as the risks, benefits, and alternatives were carefully and thoroughly reviewed with the patient. Ample time for discussion and questions allowed. The patient understood, was satisfied, and agreed to proceed.     HPI: Candice Le is a 46 y.o. female who presents for EGD for evaluation of GERD with persistent sxs and Colonoscopy for CRC screening.    Past Medical History:  Diagnosis Date   Anemia    Anxiety    Arthritis    GERD (gastroesophageal reflux disease)    Hypertension    Insomnia     Past Surgical History:  Procedure Laterality Date   ABDOMINAL SURGERY  2012   dr gross   TUBAL LIGATION      Prior to Admission medications   Medication Sig Start Date End Date Taking? Authorizing Provider  hydrochlorothiazide (HYDRODIURIL) 12.5 MG tablet Take 12.5 mg by mouth daily.   Yes [provider]  Magnesium 250 MG TABS Take 250 mg by mouth daily.   Yes [provider]  OVER THE COUNTER MEDICATION B 12 tablets two tablets daily.   Yes [provider]  zolpidem (AMBIEN) 10 MG tablet  09/23/17  Yes [provider]  ALPRAZolam (XANAX) 0.25 MG tablet Take 0.25 mg by mouth at bedtime as needed for anxiety.    [provider]  APPLE CIDER VINEGAR PO Take by mouth. Apple Cider Vinegar one teaspoon once daily.    [provider]  omeprazole (PRILOSEC) 40 MG capsule Take 40 mg by mouth 2 (two) times daily. 10/23/21 11/22/21  [provider]  valACYclovir (VALTREX) 500 MG tablet Take 1 tablet (500 mg total) by mouth 2 (two) times daily. Take for 3 days 12/16/21   Lind Covert, MD    Current Outpatient Medications   Medication Sig Dispense Refill   hydrochlorothiazide (HYDRODIURIL) 12.5 MG tablet Take 12.5 mg by mouth daily.     Magnesium 250 MG TABS Take 250 mg by mouth daily.     OVER THE COUNTER MEDICATION B 12 tablets two tablets daily.     zolpidem (AMBIEN) 10 MG tablet      ALPRAZolam (XANAX) 0.25 MG tablet Take 0.25 mg by mouth at bedtime as needed for anxiety.     APPLE CIDER VINEGAR PO Take by mouth. Apple Cider Vinegar one teaspoon once daily.     omeprazole (PRILOSEC) 40 MG capsule Take 40 mg by mouth 2 (two) times daily.     valACYclovir (VALTREX) 500 MG tablet Take 1 tablet (500 mg total) by mouth 2 (two) times daily. Take for 3 days 15 tablet 1   Current Facility-Administered Medications  Medication Dose Route Frequency Provider Last Rate Last Admin   0.9 %  sodium chloride infusion  500 mL Intravenous Once Jadakiss Barish V, DO        Allergies as of 05/12/2022 - Review Complete 05/12/2022  Allergen Reaction Noted   Other  09/08/2016   Pork-derived products  09/08/2016   Sulfa antibiotics Other (See Comments) 05/28/2012    Family History  Problem Relation Age of Onset   Hypertension Mother    Breast cancer Paternal Aunt 60   Colon cancer Neg Hx  Stomach cancer Neg Hx    Esophageal cancer Neg Hx    Pancreatic cancer Neg Hx    Rectal cancer Neg Hx     Social History   Socioeconomic History   Marital status: Legally Separated    Spouse name: Not on file   Number of children: Not on file   Years of education: Not on file   Highest education level: Not on file  Occupational History   Not on file  Tobacco Use   Smoking status: Former    Packs/day: 1.50    Years: 30.00    Pack years: 45.00    Types: Cigarettes    Quit date: 06/07/2021    Years since quitting: 0.9   Smokeless tobacco: Never  Vaping Use   Vaping Use: Never used  Substance and Sexual Activity   Alcohol use: Yes    Comment: Daily   Drug use: No   Sexual activity: Yes    Birth control/protection:  Surgical  Other Topics Concern   Not on file  Social History Narrative   Not on file   Social Determinants of Health   Financial Resource Strain: Not on file  Food Insecurity: Not on file  Transportation Needs: Not on file  Physical Activity: Not on file  Stress: Not on file  Social Connections: Not on file  Intimate Partner Violence: Not on file    Physical Exam: Vital signs in last 24 hours: '@BP'$  (!) 144/78   Pulse 78   Temp 97.8 F (36.6 C)   Ht '5\' 9"'$  (1.753 m)   Wt 164 lb (74.4 kg)   SpO2 97%   BMI 24.22 kg/m  GEN: NAD EYE: Sclerae anicteric ENT: MMM CV: Non-tachycardic Pulm: CTA b/l GI: Soft, NT/ND NEURO:  Alert & Oriented x 3   Gerrit Heck, DO [facility]   05/12/2022 1:56 PM

## 2022-05-13 ENCOUNTER — Telehealth: Payer: Self-pay | Admitting: *Deleted

## 2022-05-13 DIAGNOSIS — E2749 Other adrenocortical insufficiency: Secondary | ICD-10-CM | POA: Diagnosis not present

## 2022-05-13 NOTE — Telephone Encounter (Signed)
  Follow up Call-     05/12/2022    1:15 PM  Call back number  Post procedure Call Back phone  # [phone]  Permission to leave phone message Yes     Patient questions:  Do you have a fever, pain , or abdominal swelling? No. Pain Score  0 *  Have you tolerated food without any problems? Yes.    Have you been able to return to your normal activities? Yes.    Do you have any questions about your discharge instructions: Diet   No. Medications  No. Follow up visit  No.  Do you have questions or concerns about your Care? No.  Actions: * If pain score is 4 or above: No action needed, pain <4.

## 2022-05-14 DIAGNOSIS — Z419 Encounter for procedure for purposes other than remedying health state, unspecified: Secondary | ICD-10-CM | POA: Diagnosis not present

## 2022-05-15 ENCOUNTER — Other Ambulatory Visit: Payer: Self-pay

## 2022-05-15 ENCOUNTER — Telehealth: Payer: Self-pay | Admitting: Gastroenterology

## 2022-05-15 MED ORDER — COLESTIPOL HCL 1 G PO TABS: 1.0000 g | ORAL_TABLET | Freq: Two times a day (BID) | ORAL | 0 refills | Status: DC

## 2022-05-15 NOTE — Telephone Encounter (Signed)
Spoke with pt and gave pt recommendations. Pt verbalized understanding. Prescription sent to pt's pharmacy.  

## 2022-05-15 NOTE — Telephone Encounter (Signed)
Spoke with pt who stated that chronic diarrhea has gotten significantly worse since procedure on 5/30. Pt states that she hasn't been able to eat much because everything she eats bothers her stomach whereas before it was just certain foods. Pt states she is currently taking nothing for diarrhea because she had previously had tried imodium and had the reverse reaction. Pt stated the imodium made her diarrhea much worse. Pt unable to report how many times a day she is having diarrhea but states that diarrhea appears watery. Pt denies seeing any blood but states that she has pain in her rectum for a few hours after she has a bowel movement. Pt wanted to know what she can do for diarrhea. Pt scheduled for follow up appt with Dr. Havery Moros on 6/6 at 9:50 am. Also let pt know that it doesn't look like the pathology results are back yet. Is it ok for pt to keep this appt or are there any other recommendations?

## 2022-05-15 NOTE — Telephone Encounter (Signed)
I reviewed her pathology results.  Biopsies of her small intestine and colon are normal which is excellent news. Surprising that Imodium would cause diarrhea, that is unusual. If that is the case and she wants to try something else for her diarrhea, we can empirically try her on Colestid 1 g twice daily.  Please let her know that this is an older cholesterol medication but were using it for one of the side effects that it binds bile acids and can treat some forms of diarrhea.  I hope this helps her.  I would like her to try this and I will see her again next week for reassessment in the office.  Thanks

## 2022-05-15 NOTE — Telephone Encounter (Signed)
Patient called and stated she has been having more issues with diarrhea since having her colonoscopy and wanted to know if there was anything she could do.  She also was asking about the results from her biopsy from her recent colonoscopy. Thank you.

## 2022-05-19 ENCOUNTER — Ambulatory Visit: Payer: Medicaid Other | Admitting: Gastroenterology

## 2022-05-19 ENCOUNTER — Ambulatory Visit (INDEPENDENT_AMBULATORY_CARE_PROVIDER_SITE_OTHER): Payer: Medicaid Other | Admitting: Gastroenterology

## 2022-05-19 ENCOUNTER — Encounter: Payer: Self-pay | Admitting: Gastroenterology

## 2022-05-19 VITALS — BP 106/78 | HR 72 | Ht 69.0 in | Wt 159.1 lb

## 2022-05-19 DIAGNOSIS — K529 Noninfective gastroenteritis and colitis, unspecified: Secondary | ICD-10-CM | POA: Diagnosis not present

## 2022-05-19 NOTE — Patient Instructions (Addendum)
If you are age 47 or older, your body mass index should be between 23-30. Your Body mass index is 23.5 kg/m. If this is out of the aforementioned range listed, please consider follow up with your Primary Care Provider.  If you are age 22 or younger, your body mass index should be between 19-25. Your Body mass index is 23.5 kg/m. If this is out of the aformentioned range listed, please consider follow up with your Primary Care Provider.   ________________________________________________________  The [facility] providers would like to encourage you to use [facility] to communicate with providers for non-urgent requests or questions.  Due to long hold times on the telephone, sending your provider a message by [facility] may be a faster and more efficient way to get a response.  Please allow 48 business hours for a response.  Please remember that this is for non-urgent requests.  _______________________________________________________   We are giving you a Low-FODMAP diet handout today. FODMAPs are short-chain carbohydrates (sugars) that are highly fermentable, which means that they go through chemical changes in the GI system, and are poorly absorbed during digestion. When FODMAPs reach the colon (large intestine), bacteria ferment these sugars, turning them into gas and chemicals. This stretches the walls of the colon, causing abdominal bloating, distension, cramping, pain, and/or changes in bowel habits in many patients with IBS. FODMAPs are not unhealthy or harmful, but may exacerbate GI symptoms in those with sensitive GI tracts.  Take Colestid 1 g twice a day for one month.  Let us know how you do.  Continue Align if you think it is helping.  We have scheduled you for a follow up appointment on Thursday, 8-31 at 10:30 am.  Please call yo reschedule at your earliest convenience if this is not a convenient time for you: [phone].  You will be due for a recall colonoscopy in 04-2032. We will send you  a reminder in the mail when it gets closer to that time.   Thank you for entrusting me with your care and for choosing [facility], Dr. [place]

## 2022-05-19 NOTE — Progress Notes (Signed)
HPI :  46 year old female with a past medical history Meckel's diverticulum s/p laparoscopic closure enterostomy 2009, GERD, epiglottitis, and chronic diarrhea. Here for a follow up visit. See intake history per Tye Savoy on 10/29/21 for details.   She had a history of epiglottitis last summer and had persistent throat pain / odynophagia. Had a laryngoscopy per ENT which was unrevealing. Placed on PPI and scheduled for an EGD. She also was scheduled for a screening colonoscopy, and had endorsed chronic loose stools. These exams were done per Dr. Bryan Lemma as outlined below:  EGD 05/12/22: The examined esophagus was normal. - The Z-line was regular and was found 41 cm from the incisors. - The gastroesophageal flap valve was visualized endoscopically and classified as Hill Grade II (fold present, opens with respiration). - The entire examined stomach was normal. Biopsies were taken with a cold forceps for Helicobacter pylori testing. Estimated blood loss was minimal. - The examined duodenum was normal. Biopsies were taken with a cold forceps for histology. Estimated blood loss was minimal.   Colonoscopy 05/12/22: The perianal and digital rectal examinations were normal. - A 4 mm polyp was found in the distal sigmoid colon. The polyp was sessile. The polyp was removed with a cold snare. Resection and retrieval were complete. Estimated blood loss was minimal. - A few diverticula were found in the sigmoid colon. - Normal mucosa was found in the entire colon. Biopsies for histology were taken with a cold forceps from the right colon and left colon for evaluation of microscopic colitis. Estimated blood loss was minimal. - The retroflexed view of the distal rectum and anal verge was normal and showed no anal or rectal abnormalities. - The terminal ileum appeared normal.  1. Surgical [P], duodenal BENIGN DUODENAL MUCOSA WITH NO DIAGNOSTIC ABNORMALITY 2. Surgical [P], gastric body MINIMAL  CHRONIC GASTRITIS WITH LYMPHOID AGGREGATES NEGATIVE FOR H. PYLORI, INTESTINAL METAPLASIA, DYSPLASIA AND CARCINOMA 3. Surgical [P], random right colon sites BENIGN COLONIC MUCOSA WITH NO DIAGNOSTIC ABNORMALITY 4. Surgical [P], random left colon sites BENIGN COLONIC MUCOSA WITH NO DIAGNOSTIC ABNORMALITY 5. Surgical [P], colon, sigmoid, polyp (1) HYPERPLASTIC POLYP NEGATIVE FOR DYSPLASIA AND CARCINOMA   She states she has had some chronic loose stools for most of her adult years.  She reports she had a tubal ligation and a partial hysterectomy years ago and during that surgery was noted to have a Meckel's diverticulum that was resected as well as an appendectomy.  She has had some persistent loose stools since then.  Has variable bowel movements depending on what she is eating.  Eating tends to stimulate a bowel movement the stools are often loose and watery without blood.  She denies any nocturnal symptoms.  She has some burning in her anal area when she has loose stools.  She has used some Imodium in the past to help with this.  She states that low-dose she tolerated it okay however the more Imodium she takes, the worst her diarrhea was and she states she had a rather paradoxical reaction to it.  On review of her medications she takes magnesium for headaches but states this is only out of the past few months and her symptoms predate to use.  She denies any family history of celiac disease or IBD.  States her grandmother had a lot of intestinal issues but never an underlying diagnosis.  She has no family history of colon cancer.  She has a gallbladder in place.  She relatively recent only  has tried some probiotics as recommended by a pharmacist, she tried align, she states this has actually helped reduce her stool frequency a little bit and her urgency.  She states she has maybe tried this in the past though the benefit was short-lived.  We discussed other options.  No weight loss.  Of note, otherwise  has since stopped taking omeprazole.  She does not really endorse any problems with odynophagia anymore.  No significant reflux symptoms bother her unless she drinks alcohol.  She tends to drink alcohol only socially.  Prior imaging:  CT scan 10/30/2019: IMPRESSION: 1. Mild prominence of the renal collecting systems bilaterally without obstruction or mass lesion. This may represent the area noted on ultrasound. The finding is within normal limits. 2. Small amount of free fluid within the anatomic pelvis is likely physiologic. 3. Normal CT of the abdomen and pelvis with contrast    Past Medical History:  Diagnosis Date   Anemia    Anxiety    Arthritis    GERD (gastroesophageal reflux disease)    Hypertension    Insomnia      Past Surgical History:  Procedure Laterality Date   ABDOMINAL SURGERY  2012   dr gross   TUBAL LIGATION     Family History  Problem Relation Age of Onset   Hypertension Mother    Colon polyps Father    Breast cancer Paternal Aunt 36   Colon cancer Paternal Great-grandfather    Stomach cancer Neg Hx    Esophageal cancer Neg Hx    Pancreatic cancer Neg Hx    Rectal cancer Neg Hx    Social History   Tobacco Use   Smoking status: Former    Packs/day: 1.50    Years: 30.00    Pack years: 45.00    Types: Cigarettes    Quit date: 06/07/2021    Years since quitting: 0.9   Smokeless tobacco: Never  Vaping Use   Vaping Use: Never used  Substance Use Topics   Alcohol use: Yes    Comment: Daily   Drug use: No   Current Outpatient Medications  Medication Sig Dispense Refill   Probiotic Product (ALIGN PO) Take 1 capsule by mouth daily.     zolpidem (AMBIEN) 10 MG tablet Take 10 mg by mouth at bedtime.     ALPRAZolam (XANAX) 0.25 MG tablet Take 0.25 mg by mouth at bedtime as needed for anxiety. (Patient not taking: Reported on 05/19/2022)     APPLE CIDER VINEGAR PO Take by mouth. Apple Cider Vinegar one teaspoon once daily. (Patient not taking:  Reported on 05/19/2022)     colestipol (COLESTID) 1 g tablet Take 1 tablet (1 g total) by mouth 2 (two) times daily. (Patient not taking: Reported on 05/19/2022) 60 tablet 0   Magnesium 250 MG TABS Take 250 mg by mouth daily. (Patient not taking: Reported on 05/19/2022)     omeprazole (PRILOSEC) 40 MG capsule Take 40 mg by mouth 2 (two) times daily. (Patient not taking: Reported on 05/19/2022)     OVER THE COUNTER MEDICATION B 12 tablets two tablets daily. (Patient not taking: Reported on 05/19/2022)     valACYclovir (VALTREX) 500 MG tablet Take 1 tablet (500 mg total) by mouth 2 (two) times daily. Take for 3 days (Patient not taking: Reported on 05/19/2022) 15 tablet 1   No current facility-administered medications for this visit.   Allergies  Allergen Reactions   Other     Lettuce, inflammation   Pork-Derived  Products     Dizzy,ringing ears,hot flash   Sulfa Antibiotics Other (See Comments)    "makes me feel like I'm dying."     Review of Systems: All systems reviewed and negative except where noted in HPI.   Labs reviewed in care everywhere - normal TSH, CBC Low fasting cortisol was repeated at [facility] and normal  Physical Exam: BP 106/78   Pulse 72   Ht '5\' 9"'$  (1.753 m)   Wt 159 lb 2 oz (72.2 kg)   BMI 23.50 kg/m  Constitutional: Pleasant,well-developed, female in no acute distress. Neurological: Alert and oriented to person place and time. Psychiatric: Normal mood and affect. Behavior is normal.   ASSESSMENT AND PLAN: 46 year old female here for reassessment the following:  Chronic diarrhea  Longstanding symptoms, EGD and colonoscopy negative, labs reassuring.  She will follow-up with her primary care who ordered the fasting cortisol to see if she warrants an ACTH stim test although repeat fasting cortisol is normal.  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  We discussed options to treat this.  Recommend a low FODMAP diet and counseled her on this, handouts  provided.  If Align has helped her she can continue it, I think it safe, however unclear how durable this benefit may be.  Discussed other options, offered her an empiric trial of Colestid 1 g twice daily for a month and see if this provides any benefit.  She will keep an eye on things and touch base if worsening on the regimen.  I like to see her back in 3 to 4 months for reassessment, if symptoms persist we can consider other options.  Otherwise not due for another colonoscopy for 10 years.  Reassurance provided.  Jolly Mango, MD [facility]

## 2022-05-20 ENCOUNTER — Encounter: Payer: Self-pay | Admitting: Gastroenterology

## 2022-05-20 ENCOUNTER — Telehealth: Payer: Self-pay | Admitting: Gastroenterology

## 2022-05-20 NOTE — Telephone Encounter (Signed)
Okay sorry to hear this. Stop the colestid and replace with cholestyramine 4gm twice daily. 1 month supply. Thanks

## 2022-05-20 NOTE — Telephone Encounter (Signed)
Patient called states she is not able to take the Clestid pills seeking a powder that Dr. Havery Moros spoke with her about in the event she was not able to take the pill form.

## 2022-05-21 MED ORDER — CHOLESTYRAMINE 4 G PO PACK: 4.0000 g | PACK | Freq: Two times a day (BID) | ORAL | 0 refills | Status: DC

## 2022-05-21 NOTE — Telephone Encounter (Signed)
Cholestyramine sent to pharmacy. MyChart message to pt

## 2022-06-13 DIAGNOSIS — Z419 Encounter for procedure for purposes other than remedying health state, unspecified: Secondary | ICD-10-CM | POA: Diagnosis not present

## 2022-06-17 ENCOUNTER — Other Ambulatory Visit: Payer: Self-pay | Admitting: Gastroenterology

## 2022-06-18 MED ORDER — CHOLESTYRAMINE 4 G PO PACK: 4.0000 g | PACK | Freq: Two times a day (BID) | ORAL | 2 refills | Status: DC

## 2022-07-14 DIAGNOSIS — Z419 Encounter for procedure for purposes other than remedying health state, unspecified: Secondary | ICD-10-CM | POA: Diagnosis not present

## 2022-08-13 ENCOUNTER — Encounter: Payer: Self-pay | Admitting: Gastroenterology

## 2022-08-13 ENCOUNTER — Ambulatory Visit (INDEPENDENT_AMBULATORY_CARE_PROVIDER_SITE_OTHER): Payer: Medicaid Other | Admitting: Gastroenterology

## 2022-08-13 VITALS — BP 128/72 | HR 84 | Ht 69.0 in | Wt 154.0 lb

## 2022-08-13 DIAGNOSIS — R1012 Left upper quadrant pain: Secondary | ICD-10-CM

## 2022-08-13 DIAGNOSIS — K529 Noninfective gastroenteritis and colitis, unspecified: Secondary | ICD-10-CM | POA: Diagnosis not present

## 2022-08-13 MED ORDER — CHOLESTYRAMINE 4 G PO PACK
4.0000 g | PACK | Freq: Two times a day (BID) | ORAL | 11 refills | Status: DC
Start: 2022-08-13 — End: 2023-09-27

## 2022-08-13 NOTE — Progress Notes (Signed)
HPI :  46 year old female with a past medical history Meckel's diverticulum s/p laparoscopic closure enterostomy 2009, GERD, epiglottitis, chronic diarrhea. Here for a follow up visit.   Recall she has a history of chronic diarrhea for many years.  She has had significant urgency postprandially, depending on what she is eating.  No nocturnal symptoms.  She had a colonoscopy and EGD back in May which showed no evidence of celiac disease, no microscopic colitis etc.  Since her last visit I placed her on a trial of cholestyramine.  Initially was placed on Colestid but she could not swallow the pills.  She has been on cholestyramine 4 g twice daily.  She states this has made a significant improvement in her day-to-day life.  She has 1-2 bowel movements per day which are formed.  Diarrhea has completely resolved and she is feeling much better.  She can have occasional bloating that can bother her but generally doing well.  She is try to cut out a lot of fast food and other foods that can trigger her symptoms as well.  We had discussed a low FODMAP diet but she did not want to try that.  Overall she is very happy with the regimen.  She has no history of high cholesterol or elevated triglycerides, she is due for basic labs with lipid panel in a few months.  She has also tried decreasing her alcohol intake, previously was drinking 1-2 drinks per night, has cut back and she thinks that also helps her bowels  She otherwise endorses fleeting left upper quadrant pain that lasted about 24 hours in recent weeks.  It was along the costal margin, was positional, can get relief lying on her right side.  She tried taking some ibuprofen, eventually it passed.  She denies any reflux, no nausea or vomiting, no fevers etc.   Prior work-up: EGD 05/12/22: The examined esophagus was normal. - The Z-line was regular and was found 41 cm from the incisors. - The gastroesophageal flap valve was visualized endoscopically and  classified as Hill Grade II (fold present, opens with respiration). - The entire examined stomach was normal. Biopsies were taken with a cold forceps for Helicobacter pylori testing. Estimated blood loss was minimal. - The examined duodenum was normal. Biopsies were taken with a cold forceps for histology. Estimated blood loss was minimal.     Colonoscopy 05/12/22: The perianal and digital rectal examinations were normal. - A 4 mm polyp was found in the distal sigmoid colon. The polyp was sessile. The polyp was removed with a cold snare. Resection and retrieval were complete. Estimated blood loss was minimal. - A few diverticula were found in the sigmoid colon. - Normal mucosa was found in the entire colon. Biopsies for histology were taken with a cold forceps from the right colon and left colon for evaluation of microscopic colitis. Estimated blood loss was minimal. - The retroflexed view of the distal rectum and anal verge was normal and showed no anal or rectal abnormalities. - The terminal ileum appeared normal.   1. Surgical [P], duodenal BENIGN DUODENAL MUCOSA WITH NO DIAGNOSTIC ABNORMALITY 2. Surgical [P], gastric body MINIMAL CHRONIC GASTRITIS WITH LYMPHOID AGGREGATES NEGATIVE FOR H. PYLORI, INTESTINAL METAPLASIA, DYSPLASIA AND CARCINOMA 3. Surgical [P], random right colon sites BENIGN COLONIC MUCOSA WITH NO DIAGNOSTIC ABNORMALITY 4. Surgical [P], random left colon sites BENIGN COLONIC MUCOSA WITH NO DIAGNOSTIC ABNORMALITY 5. Surgical [P], colon, sigmoid, polyp (1) HYPERPLASTIC POLYP NEGATIVE FOR DYSPLASIA AND CARCINOMA  CT scan 10/30/2019: IMPRESSION: 1. Mild prominence of the renal collecting systems bilaterally without obstruction or mass lesion. This may represent the area noted on ultrasound. The finding is within normal limits. 2. Small amount of free fluid within the anatomic pelvis is likely physiologic. 3. Normal CT of the abdomen and pelvis with contrast      Past Medical History:  Diagnosis Date   Anemia    Anxiety    Arthritis    Chronic diarrhea    GERD (gastroesophageal reflux disease)    Hypertension    Insomnia      Past Surgical History:  Procedure Laterality Date   ABDOMINAL SURGERY  2012   dr gross   TUBAL LIGATION     Family History  Problem Relation Age of Onset   Hypertension Mother    Colon polyps Father    Breast cancer Paternal Aunt 24   Colon cancer Paternal Great-grandfather    Stomach cancer Neg Hx    Esophageal cancer Neg Hx    Pancreatic cancer Neg Hx    Rectal cancer Neg Hx    Social History   Tobacco Use   Smoking status: Former    Packs/day: 1.50    Years: 30.00    Total pack years: 45.00    Types: Cigarettes    Quit date: 06/07/2021    Years since quitting: 1.1   Smokeless tobacco: Never  Vaping Use   Vaping Use: Never used  Substance Use Topics   Alcohol use: Yes    Comment: Daily   Drug use: No   Current Outpatient Medications  Medication Sig Dispense Refill   Probiotic Product (ALIGN PO) Take 1 capsule by mouth as needed.     zolpidem (AMBIEN) 10 MG tablet Take 10 mg by mouth at bedtime.     cholestyramine (QUESTRAN) 4 g packet Take 1 packet (4 g total) by mouth 2 (two) times daily. 60 each 11   No current facility-administered medications for this visit.   Allergies  Allergen Reactions   Other     Lettuce, inflammation   Pork-Derived Products     Dizzy,ringing ears,hot flash   Sulfa Antibiotics Other (See Comments)    "makes me feel like I'm dying."     Review of Systems: All systems reviewed and negative except where noted in HPI.   Lab Results  Component Value Date   WBC 7.0 11/03/2021   HGB 12.9 11/03/2021   HCT 39.2 11/03/2021   MCV 94.7 11/03/2021   PLT 277 11/03/2021    Lab Results  Component Value Date   CREATININE 0.81 11/03/2021   BUN 8 11/03/2021   NA 139 11/03/2021   K 3.4 (L) 11/03/2021   CL 104 11/03/2021   CO2 25 11/03/2021    Lab Results   Component Value Date   ALT 11 10/30/2019   AST 16 10/30/2019   ALKPHOS 60 10/30/2019   BILITOT 0.7 10/30/2019     Physical Exam: BP 128/72   Pulse 84   Ht '5\' 9"'$  (1.753 m)   Wt 154 lb (69.9 kg)   BMI 22.74 kg/m  Constitutional: Pleasant,well-developed, female in no acute distress. Abdominal: Soft, nondistended, nontender.  There are no masses palpable.  Extremities: no edema Neurological: Alert and oriented to person place and time. Psychiatric: Normal mood and affect. Behavior is normal.   ASSESSMENT: 46 y.o. female here for assessment of the following  1. Chronic diarrhea   2. LUQ pain    As above, chronic  diarrhea, likely IBS or related to dietary intake.  Colonoscopy negative, tested negative for celiac disease.  We had discussed options at our last visit, given postprandial nature of this we added cholestyramine twice daily.  This has provided significant benefit to her and she is feeling much better, she likes the regimen.  She wants to continue this for now.  She has no history of triglyceride problems in the past, recommend lipid panel to make sure things are stable, she will do this with her primary care in the next few months.  Otherwise we will continue dosing once to twice daily as needed.  For her occasional bloating we will add some Bentyl to use as needed.  She had some recent left upper quadrant pain over the costal margin in recent weeks.  Was fleeting and somewhat positional.  No other upper tract symptoms.  Sounds like it may have been musculoskeletal, she had a massage the day prior to onset.  She will contact me if recurrence.  PLAN: - continue cholestyramine - check lipid panel with yearly labs, per PCP   - IB gard samples given to use PRN - monitor for recurrent LUQ pain, suspect musculoskeletal - f/u yearly  Jolly Mango, MD [facility]

## 2022-08-13 NOTE — Patient Instructions (Signed)
We have sent the following medications to your pharmacy for you to pick up at your convenience: cholestyramine.   Start IBgard samples take as directed  on package as needed. This can be purchased over the counter.   The [facility] providers would like to encourage you to use [facility] to communicate with providers for non-urgent requests or questions.  Due to long hold times on the telephone, sending your provider a message by [facility] may be a faster and more efficient way to get a response.  Please allow 48 business hours for a response.  Please remember that this is for non-urgent requests.

## 2022-08-14 DIAGNOSIS — Z419 Encounter for procedure for purposes other than remedying health state, unspecified: Secondary | ICD-10-CM | POA: Diagnosis not present

## 2022-08-25 DIAGNOSIS — R399 Unspecified symptoms and signs involving the genitourinary system: Secondary | ICD-10-CM | POA: Diagnosis not present

## 2022-08-25 DIAGNOSIS — N898 Other specified noninflammatory disorders of vagina: Secondary | ICD-10-CM | POA: Diagnosis not present

## 2022-09-06 DIAGNOSIS — M791 Myalgia, unspecified site: Secondary | ICD-10-CM | POA: Diagnosis not present

## 2022-09-06 DIAGNOSIS — U071 COVID-19: Secondary | ICD-10-CM | POA: Diagnosis not present

## 2022-09-06 DIAGNOSIS — F17210 Nicotine dependence, cigarettes, uncomplicated: Secondary | ICD-10-CM | POA: Diagnosis not present

## 2022-09-06 DIAGNOSIS — M25532 Pain in left wrist: Secondary | ICD-10-CM | POA: Diagnosis not present

## 2022-09-06 DIAGNOSIS — M25551 Pain in right hip: Secondary | ICD-10-CM | POA: Diagnosis not present

## 2022-09-11 DIAGNOSIS — F5104 Psychophysiologic insomnia: Secondary | ICD-10-CM | POA: Diagnosis not present

## 2022-09-11 DIAGNOSIS — Z0000 Encounter for general adult medical examination without abnormal findings: Secondary | ICD-10-CM | POA: Diagnosis not present

## 2022-09-13 DIAGNOSIS — Z419 Encounter for procedure for purposes other than remedying health state, unspecified: Secondary | ICD-10-CM | POA: Diagnosis not present

## 2022-09-15 ENCOUNTER — Other Ambulatory Visit: Payer: Self-pay | Admitting: Family Medicine

## 2022-09-15 DIAGNOSIS — N6321 Unspecified lump in the left breast, upper outer quadrant: Secondary | ICD-10-CM

## 2022-09-15 DIAGNOSIS — Z0000 Encounter for general adult medical examination without abnormal findings: Secondary | ICD-10-CM | POA: Diagnosis not present

## 2022-09-15 DIAGNOSIS — N6311 Unspecified lump in the right breast, upper outer quadrant: Secondary | ICD-10-CM

## 2022-09-29 IMAGING — DX DG CHEST 2V
2 series · 2 of 2 positions shown · non-contrast
Comparison: 03/30/2021.

CLINICAL DATA: Chest pain.

EXAM:
CHEST - 2 VIEW

[w chest pa]
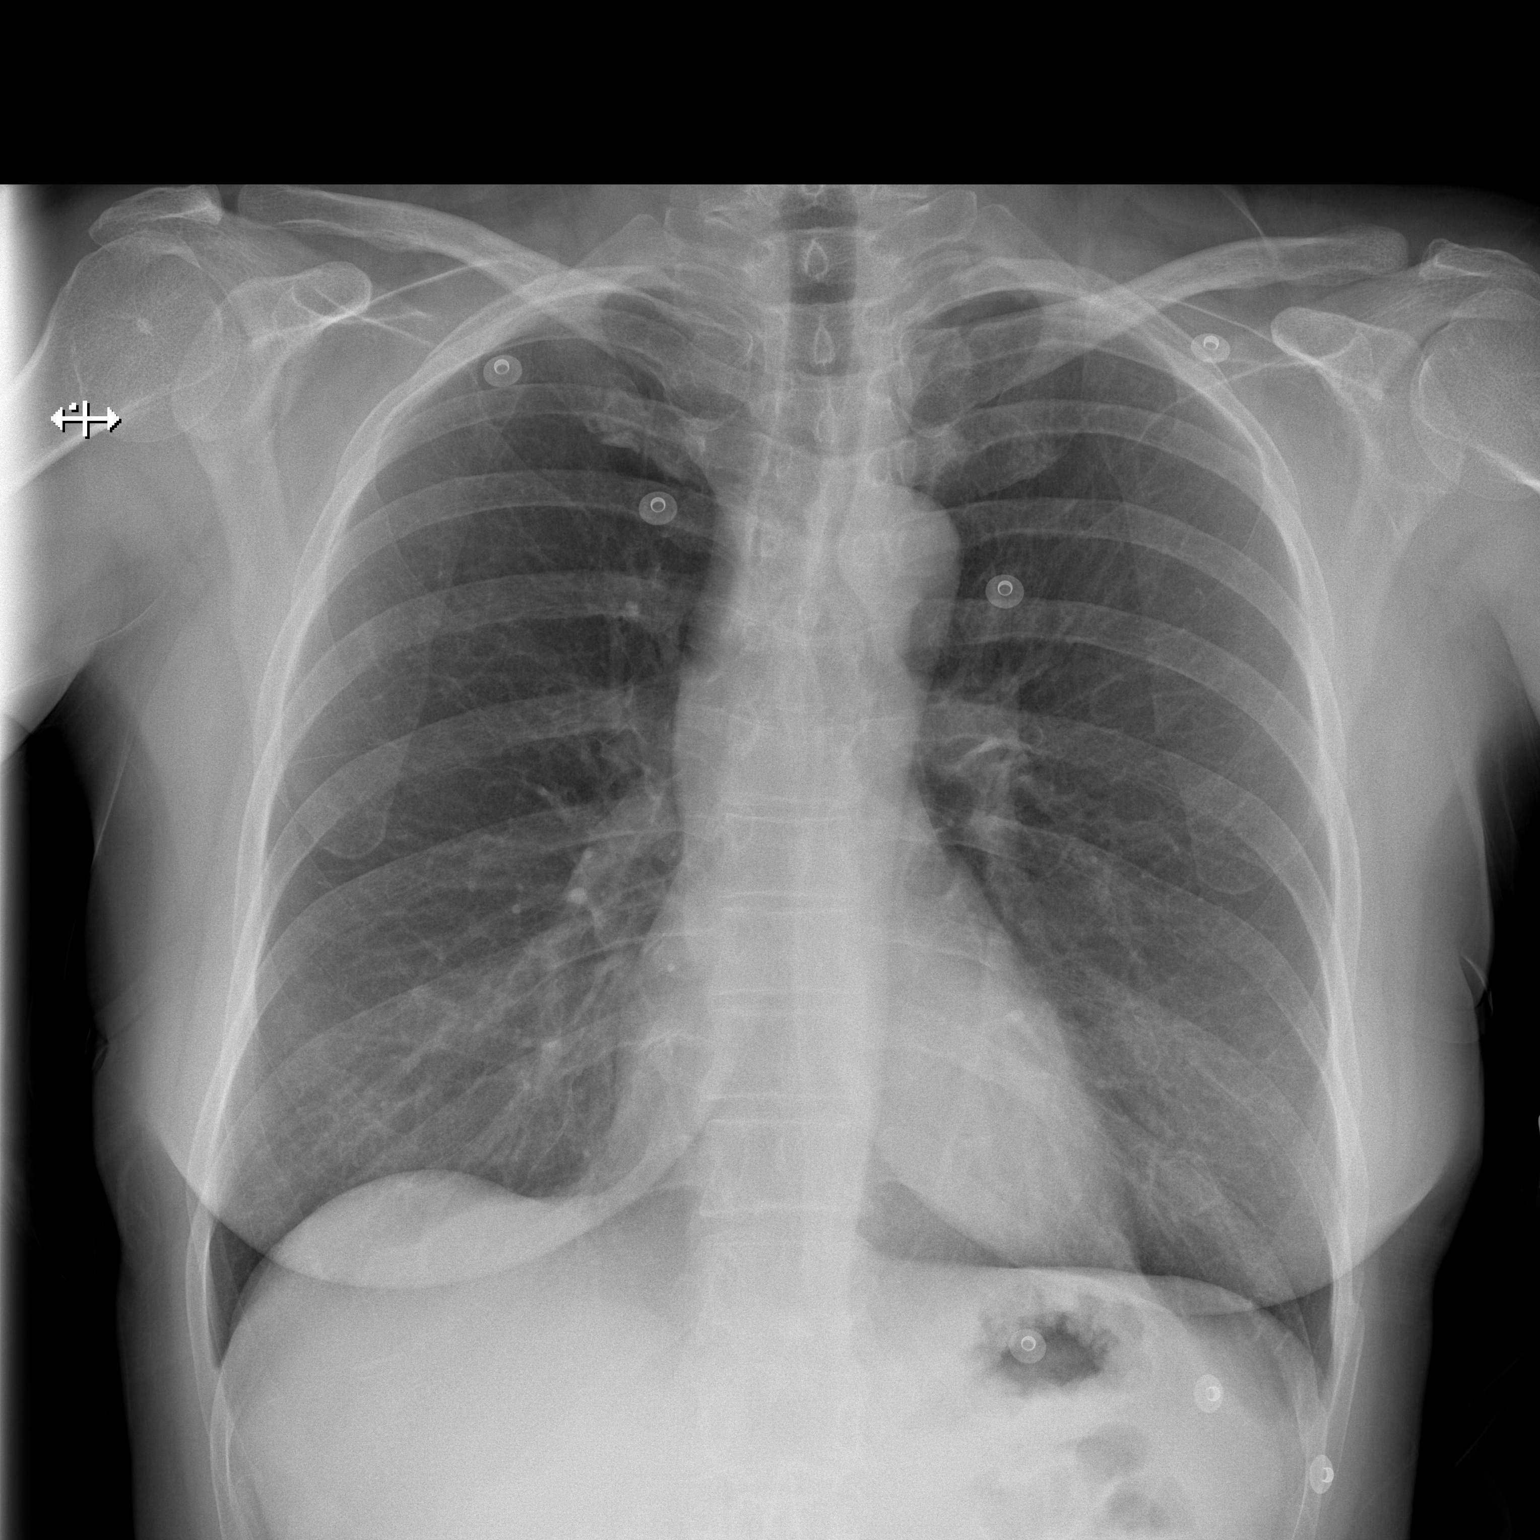

[w chest lat]
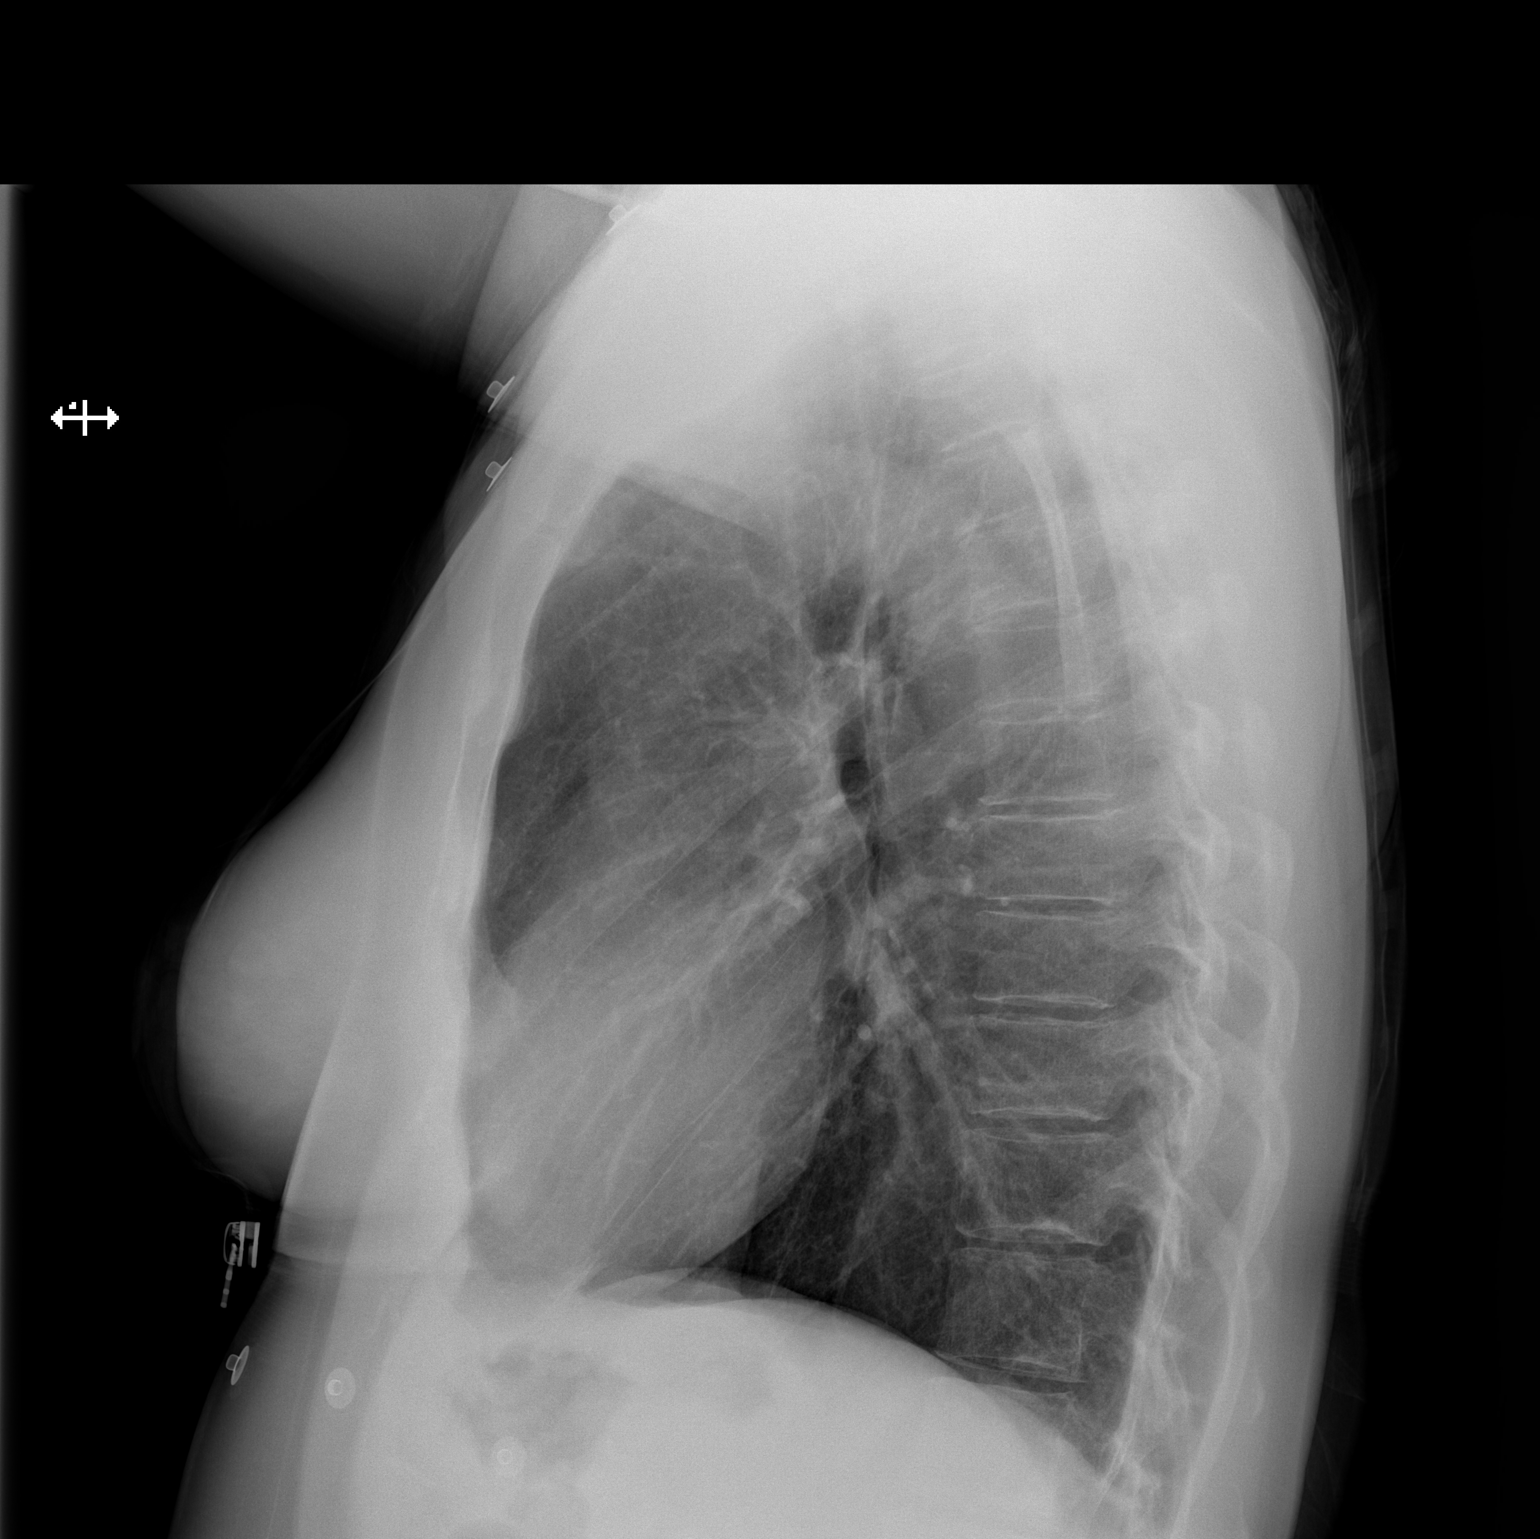

[2 of 2 positions shown; findings below may reference images not displayed]

FINDINGS: The heart size and mediastinal contours are within normal limits.
Both lungs are clear. No acute osseous abnormality.
IMPRESSION: No active cardiopulmonary disease.

## 2022-09-29 IMAGING — CT CT HEAD W/O CM
3 series · 16 of 47 positions shown, 19 images · non-contrast
Comparison: Head CT dated 07/13/2012.

CLINICAL DATA: Headache.

EXAM:
CT HEAD WITHOUT CONTRAST
TECHNIQUE: Contiguous axial images were obtained from the base of the skull
through the vertex without intravenous contrast.

[Series 3: head 5.0 h30s · axial · 0.43mm/px · z∈[+1058,+1198]mm · 10 of 34 slices shown, 13 images]
[im 3/34  brain]
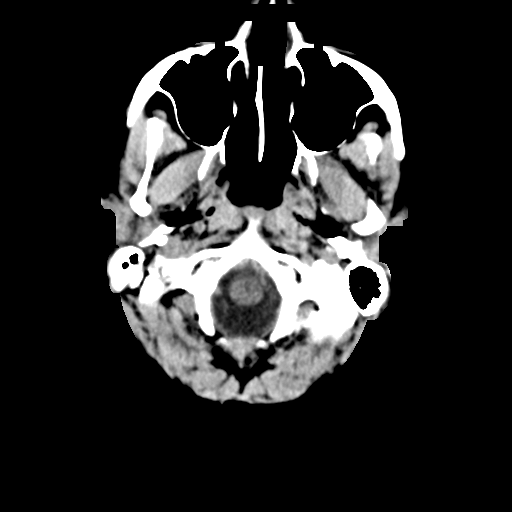
[im 3/34  bone]
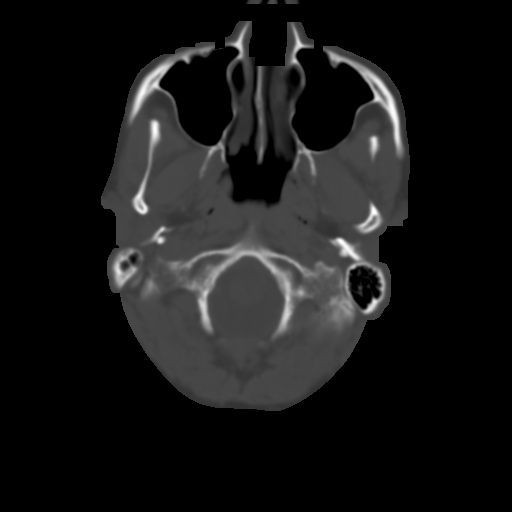
[im 6/34  brain]
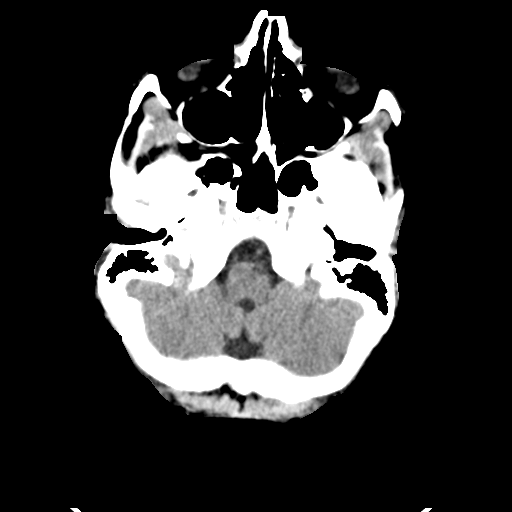
[im 10/34  brain]
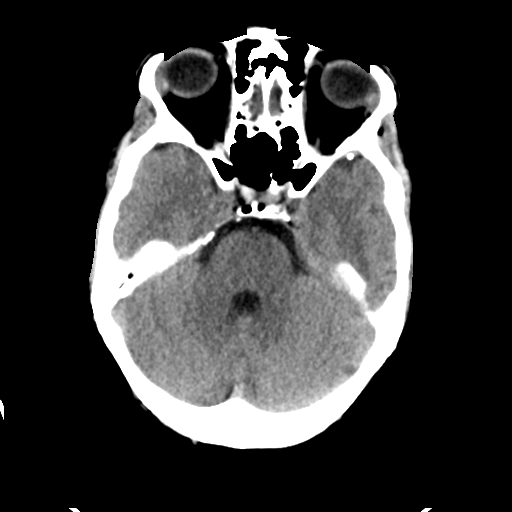
[im 12/34  brain]
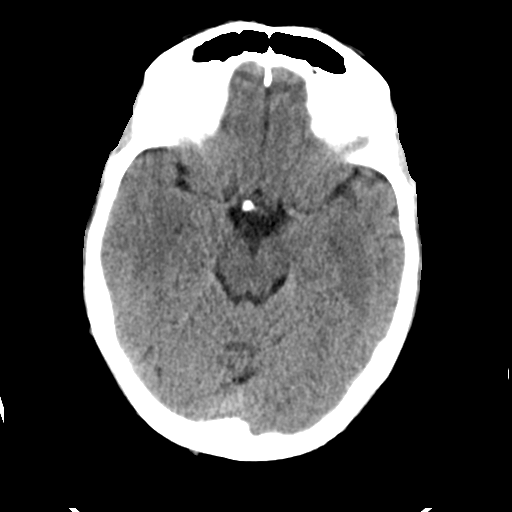
[im 15/34  brain]
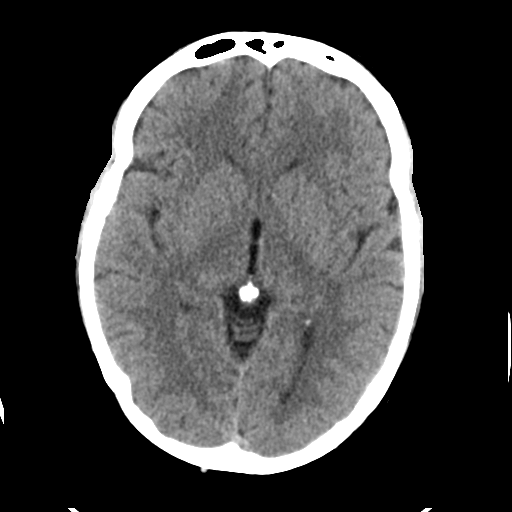
[im 15/34  bone]
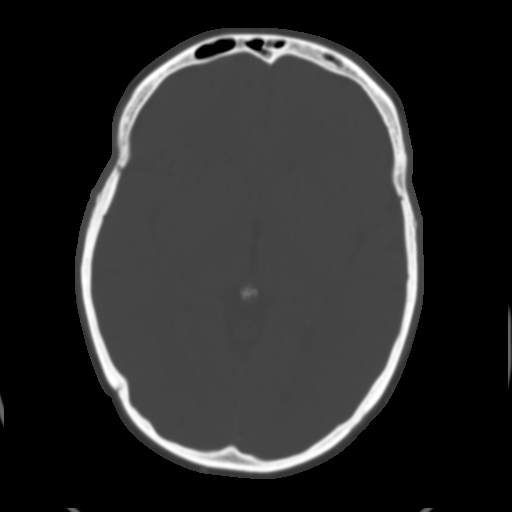
[im 19/34  brain]
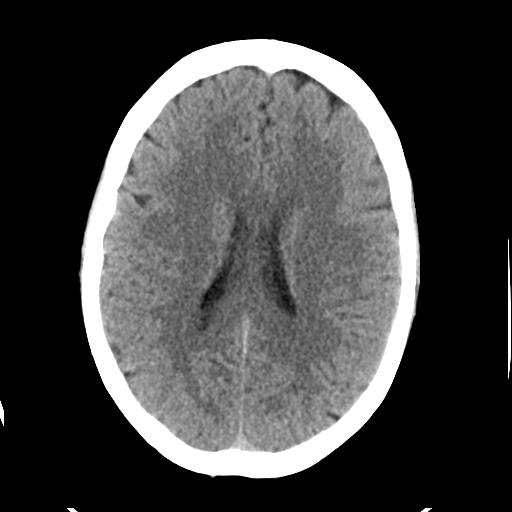
[im 22/34  brain]
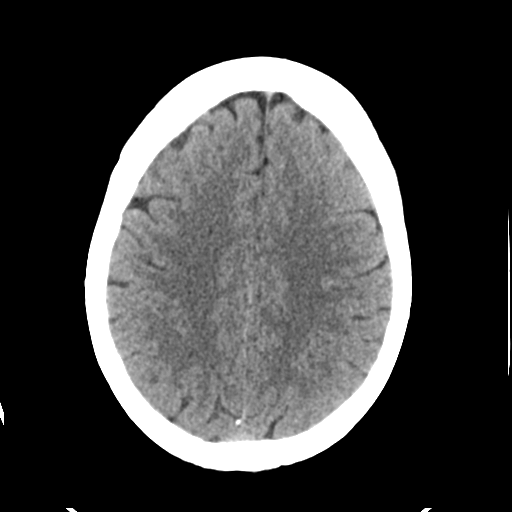
[im 26/34  brain]
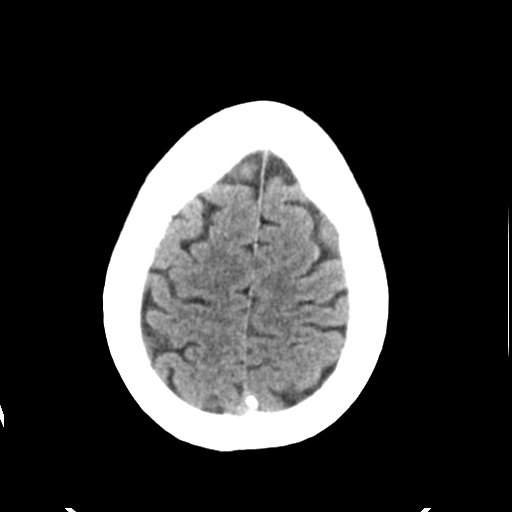
[im 28/34  brain]
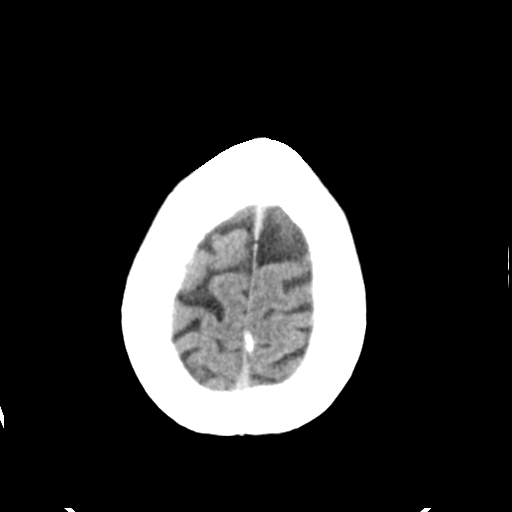
[im 28/34  bone]
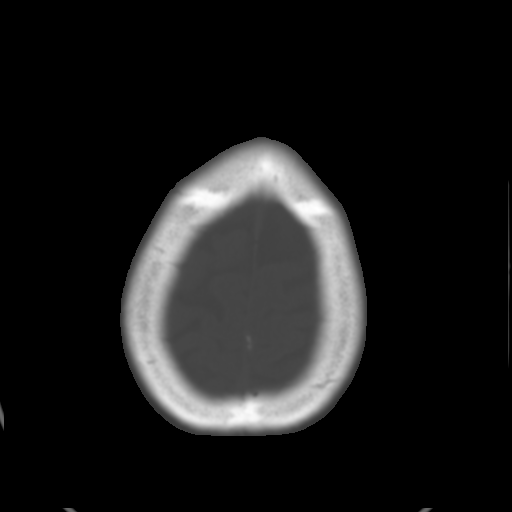
[im 31/34  brain]
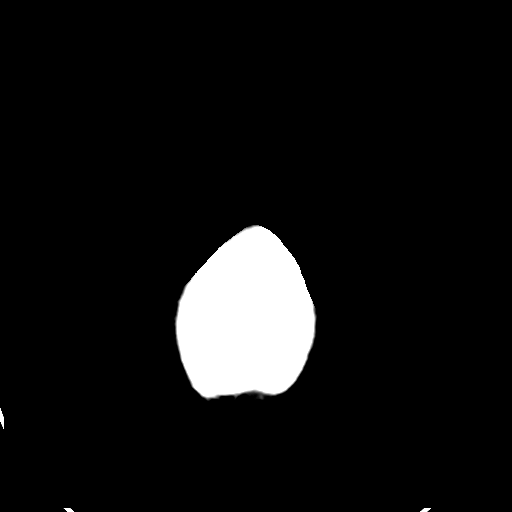

[Series 5: head 3.0 mpr cor · coronal · 0.31mm/px · 3 of 69 slices shown]
[im 23/69  brain]
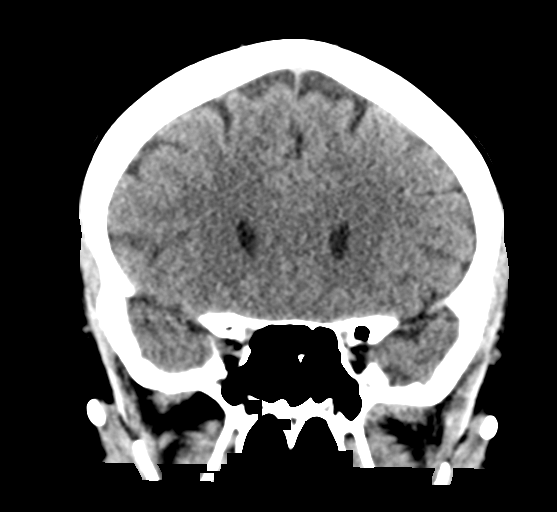
[im 31/69  brain]
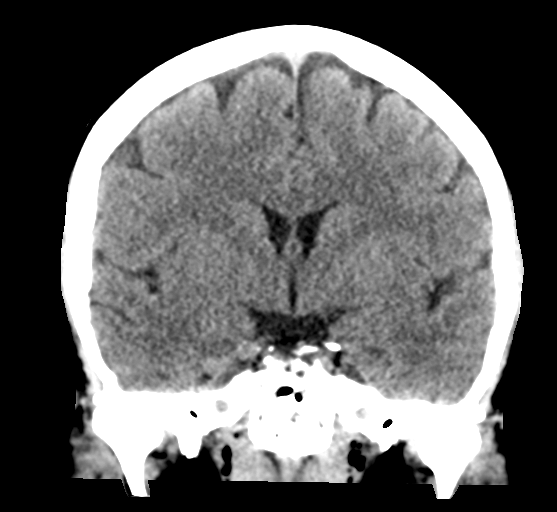
[im 38/69  brain]
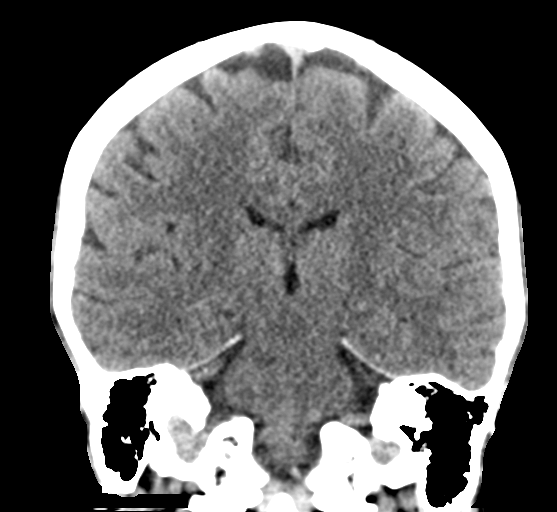

[Series 6: head 3.0 mpr sag · sagittal · 0.32mm/px · 3 of 60 slices shown]
[im 20/60  brain]
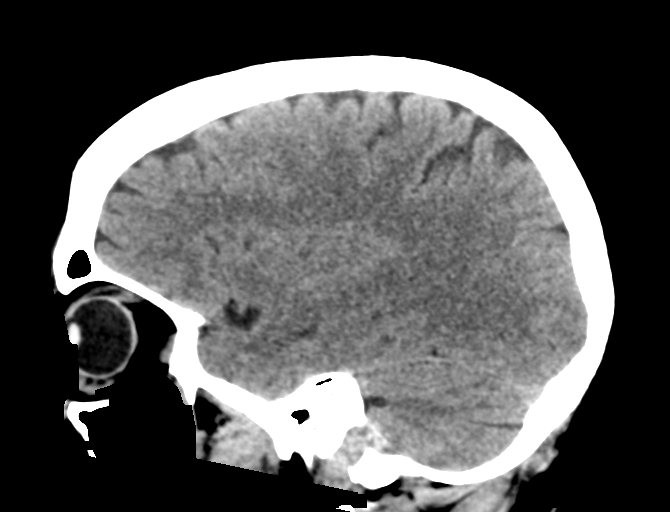
[im 30/60  brain]
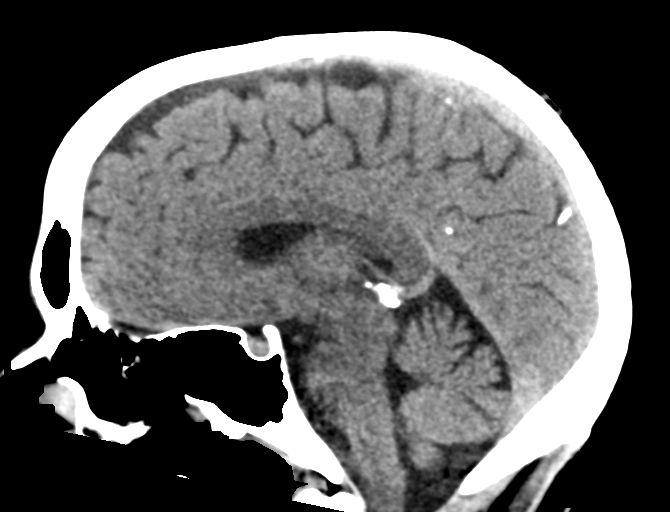
[im 40/60  brain]
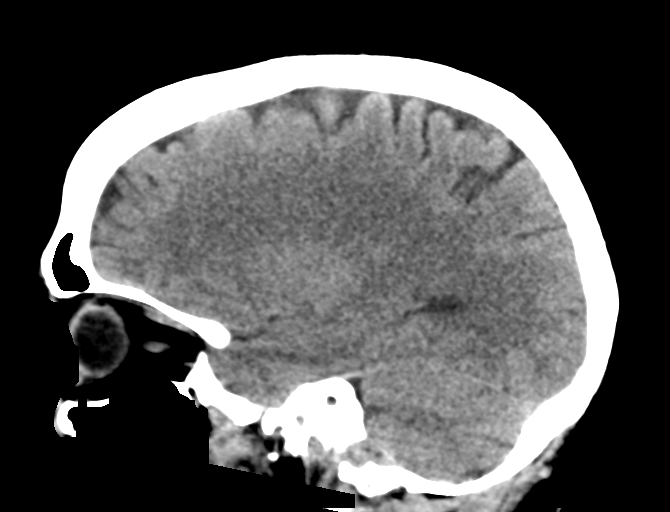

[16 of 47 positions shown; findings below may reference images not displayed]

FINDINGS: Brain: No evidence of acute infarction, hemorrhage, hydrocephalus,
extra-axial collection or mass lesion/mass effect.

Vascular: No hyperdense vessel or unexpected calcification.

Skull: Normal. Negative for fracture or focal lesion.

Sinuses/Orbits: No acute finding.

Other: None
IMPRESSION: Normal noncontrast CT of the brain.

## 2022-10-01 ENCOUNTER — Other Ambulatory Visit: Payer: Medicaid Other

## 2022-10-14 DIAGNOSIS — Z419 Encounter for procedure for purposes other than remedying health state, unspecified: Secondary | ICD-10-CM | POA: Diagnosis not present

## 2022-10-16 ENCOUNTER — Ambulatory Visit
Admission: RE | Admit: 2022-10-16 | Discharge: 2022-10-16 | Disposition: A | Discharge status: Home / Self Care | Payer: Medicaid Other | Source: Ambulatory Visit | Attending: Family Medicine | Admitting: Family Medicine

## 2022-10-16 DIAGNOSIS — N6321 Unspecified lump in the left breast, upper outer quadrant: Secondary | ICD-10-CM | POA: Diagnosis not present

## 2022-10-16 DIAGNOSIS — N6311 Unspecified lump in the right breast, upper outer quadrant: Secondary | ICD-10-CM

## 2022-11-13 DIAGNOSIS — Z419 Encounter for procedure for purposes other than remedying health state, unspecified: Secondary | ICD-10-CM | POA: Diagnosis not present

## 2022-12-14 DIAGNOSIS — Z419 Encounter for procedure for purposes other than remedying health state, unspecified: Secondary | ICD-10-CM | POA: Diagnosis not present

## 2023-01-14 DIAGNOSIS — Z419 Encounter for procedure for purposes other than remedying health state, unspecified: Secondary | ICD-10-CM | POA: Diagnosis not present

## 2023-02-12 DIAGNOSIS — Z419 Encounter for procedure for purposes other than remedying health state, unspecified: Secondary | ICD-10-CM | POA: Diagnosis not present

## 2023-03-15 DIAGNOSIS — Z419 Encounter for procedure for purposes other than remedying health state, unspecified: Secondary | ICD-10-CM | POA: Diagnosis not present

## 2023-03-16 DIAGNOSIS — R7303 Prediabetes: Secondary | ICD-10-CM | POA: Diagnosis not present

## 2023-03-16 DIAGNOSIS — R1031 Right lower quadrant pain: Secondary | ICD-10-CM | POA: Diagnosis not present

## 2023-03-16 DIAGNOSIS — F5104 Psychophysiologic insomnia: Secondary | ICD-10-CM | POA: Diagnosis not present

## 2023-03-16 DIAGNOSIS — R1032 Left lower quadrant pain: Secondary | ICD-10-CM | POA: Diagnosis not present

## 2023-04-14 DIAGNOSIS — Z419 Encounter for procedure for purposes other than remedying health state, unspecified: Secondary | ICD-10-CM | POA: Diagnosis not present

## 2023-04-28 ENCOUNTER — Other Ambulatory Visit: Payer: Self-pay | Admitting: Family Medicine

## 2023-05-03 ENCOUNTER — Other Ambulatory Visit: Payer: Self-pay | Admitting: Family Medicine

## 2023-05-03 DIAGNOSIS — N63 Unspecified lump in unspecified breast: Secondary | ICD-10-CM

## 2023-05-12 ENCOUNTER — Inpatient Hospital Stay: Admission: RE | Admit: 2023-05-12 | Payer: Medicaid Other | Source: Ambulatory Visit

## 2023-05-15 DIAGNOSIS — Z419 Encounter for procedure for purposes other than remedying health state, unspecified: Secondary | ICD-10-CM | POA: Diagnosis not present

## 2023-06-14 DIAGNOSIS — Z419 Encounter for procedure for purposes other than remedying health state, unspecified: Secondary | ICD-10-CM | POA: Diagnosis not present

## 2023-07-15 DIAGNOSIS — Z419 Encounter for procedure for purposes other than remedying health state, unspecified: Secondary | ICD-10-CM | POA: Diagnosis not present

## 2023-08-15 DIAGNOSIS — Z419 Encounter for procedure for purposes other than remedying health state, unspecified: Secondary | ICD-10-CM | POA: Diagnosis not present

## 2023-09-14 DIAGNOSIS — Z419 Encounter for procedure for purposes other than remedying health state, unspecified: Secondary | ICD-10-CM | POA: Diagnosis not present

## 2023-09-24 DIAGNOSIS — F5104 Psychophysiologic insomnia: Secondary | ICD-10-CM | POA: Diagnosis not present

## 2023-09-24 DIAGNOSIS — R5383 Other fatigue: Secondary | ICD-10-CM | POA: Diagnosis not present

## 2023-09-24 DIAGNOSIS — E349 Endocrine disorder, unspecified: Secondary | ICD-10-CM | POA: Diagnosis not present

## 2023-09-24 DIAGNOSIS — R7303 Prediabetes: Secondary | ICD-10-CM | POA: Diagnosis not present

## 2023-09-24 DIAGNOSIS — Z0000 Encounter for general adult medical examination without abnormal findings: Secondary | ICD-10-CM | POA: Diagnosis not present

## 2023-09-27 ENCOUNTER — Other Ambulatory Visit: Payer: Self-pay | Admitting: Gastroenterology

## 2023-10-15 DIAGNOSIS — Z419 Encounter for procedure for purposes other than remedying health state, unspecified: Secondary | ICD-10-CM | POA: Diagnosis not present

## 2023-10-26 ENCOUNTER — Other Ambulatory Visit: Payer: Self-pay | Admitting: Gastroenterology

## 2023-11-03 ENCOUNTER — Telehealth: Payer: Self-pay | Admitting: Gastroenterology

## 2023-11-03 MED ORDER — CHOLESTYRAMINE 4 G PO PACK: 4.0000 g | PACK | Freq: Two times a day (BID) | ORAL | 3 refills | Status: AC

## 2023-11-03 NOTE — Telephone Encounter (Signed)
Sent refills to get to Feb appt

## 2023-11-03 NOTE — Telephone Encounter (Signed)
Inbound call from patient requesting refill for Questran. Patient was scheduled for OV with Colleen on 2/10 at 11:30. Requesting refill be sent. Please advise.

## 2023-11-14 DIAGNOSIS — Z419 Encounter for procedure for purposes other than remedying health state, unspecified: Secondary | ICD-10-CM | POA: Diagnosis not present

## 2023-12-15 DIAGNOSIS — Z419 Encounter for procedure for purposes other than remedying health state, unspecified: Secondary | ICD-10-CM | POA: Diagnosis not present

## 2024-01-15 DIAGNOSIS — Z419 Encounter for procedure for purposes other than remedying health state, unspecified: Secondary | ICD-10-CM | POA: Diagnosis not present

## 2024-01-24 ENCOUNTER — Ambulatory Visit: Payer: Medicaid Other | Admitting: Nurse Practitioner

## 2024-01-24 NOTE — Progress Notes (Deleted)
 01/24/2024 Candice Le [id number] 11/23/1976   Chief Complaint:  History of Present Illness: Candice Le is a 48 year old female with a past medical history of a left rib fracture 03/2021, Meckel's diverticulum s/p laparoscopic closure enterostomy 2009, GERD, epiglottitis, diverticulosis and a hyperplastic colon polyp. Past tubal ligation.      Latest Ref Rng & Units 11/03/2021    9:44 PM 10/20/2021    3:50 PM 01/20/2021    5:50 PM  CBC  WBC 4.0 - 10.5 K/uL 7.0   7.2   Hemoglobin 12.0 - 15.0 g/dL 69.6  29.5  28.4   Hematocrit 36.0 - 46.0 % 39.2   40.0   Platelets 150 - 400 K/uL 277   280        Latest Ref Rng & Units 11/03/2021    9:44 PM 01/20/2021    5:50 PM 10/30/2019   10:47 AM  CMP  Glucose 70 - 99 mg/dL 132  440  81   BUN 6 - 20 mg/dL 8  8  13    Creatinine 0.44 - 1.00 mg/dL 1.02  7.25  3.66   Sodium 135 - 145 mmol/L 139  138  141   Potassium 3.5 - 5.1 mmol/L 3.4  3.7  3.4   Chloride 98 - 111 mmol/L 104  103  105   CO2 22 - 32 mmol/L 25  23  25    Calcium 8.9 - 10.3 mg/dL 9.4  9.6  8.8   Total Protein 6.5 - 8.1 g/dL   6.8   Total Bilirubin 0.3 - 1.2 mg/dL   0.7   Alkaline Phos 38 - 126 U/L   60   AST 15 - 41 U/L   16   ALT 0 - 44 U/L   11      EGD 05/12/2022: - Normal esophagus. - Z-line regular, 41 cm from the incisors.  - Gastroesophageal flap valve classified as Hill Grade II (fold present, opens with respiration).  - Normal stomach. Biopsied.  - Normal examined duodenum. Biopsied.  Colonoscopy 05/12/2022: - One 4 mm polyp in the distal sigmoid colon, removed with a cold snare. Resected and retrieved.  - Diverticulosis in the sigmoid colon.  - Normal mucosa in the entire examined colon. Biopsied.  - The distal rectum and anal verge are normal on retroflexion view.  - The examined portion of the ileum was normal. - Recall colonoscopy 10 years  1. Surgical [P], duodenal BENIGN DUODENAL MUCOSA WITH NO DIAGNOSTIC ABNORMALITY 2. Surgical [P],  gastric body MINIMAL CHRONIC GASTRITIS WITH LYMPHOID AGGREGATES NEGATIVE FOR H. PYLORI, INTESTINAL METAPLASIA, DYSPLASIA AND CARCINOMA 3. Surgical [P], random right colon sites BENIGN COLONIC MUCOSA WITH NO DIAGNOSTIC ABNORMALITY 4. Surgical [P], random left colon sites BENIGN COLONIC MUCOSA WITH NO DIAGNOSTIC ABNORMALITY 5. Surgical [P], colon, sigmoid, polyp (1) HYPERPLASTIC POLYP NEGATIVE FOR DYSPLASIA AND CARCINOMA    Current Medications, Allergies, Past Medical History, Past Surgical History, Family History and Social History were reviewed in [employer] record.   Review of Systems:   Constitutional: Negative for fever, sweats, chills or weight loss.  Respiratory: Negative for shortness of breath.   Cardiovascular: Negative for chest pain, palpitations and leg swelling.  Gastrointestinal: See HPI.  Musculoskeletal: Negative for back pain or muscle aches.  Neurological: Negative for dizziness, headaches or paresthesias.    Physical Exam: There were no vitals taken for this visit. General: in no acute distress. Head: Normocephalic and atraumatic. Eyes: No scleral icterus.  Conjunctiva pink . Ears: Normal auditory acuity. Mouth: Dentition intact. No ulcers or lesions.  Lungs: Clear throughout to auscultation. Heart: Regular rate and rhythm, no murmur. Abdomen: Soft, nontender and nondistended. No masses or hepatomegaly. Normal bowel sounds x 4 quadrants.  Rectal: *** Musculoskeletal: Symmetrical with no gross deformities. Extremities: No edema. Neurological: Alert oriented x 4. No focal deficits.  Psychological: Alert and cooperative. Normal mood and affect  Assessment and Recommendations: ***

## 2024-02-12 DIAGNOSIS — Z419 Encounter for procedure for purposes other than remedying health state, unspecified: Secondary | ICD-10-CM | POA: Diagnosis not present

## 2024-03-25 DIAGNOSIS — Z419 Encounter for procedure for purposes other than remedying health state, unspecified: Secondary | ICD-10-CM | POA: Diagnosis not present

## 2024-04-06 DIAGNOSIS — R5383 Other fatigue: Secondary | ICD-10-CM | POA: Diagnosis not present

## 2024-04-06 DIAGNOSIS — R7303 Prediabetes: Secondary | ICD-10-CM | POA: Diagnosis not present

## 2024-04-06 DIAGNOSIS — E349 Endocrine disorder, unspecified: Secondary | ICD-10-CM | POA: Diagnosis not present

## 2024-04-06 DIAGNOSIS — F5104 Psychophysiologic insomnia: Secondary | ICD-10-CM | POA: Diagnosis not present

## 2024-04-24 DIAGNOSIS — Z419 Encounter for procedure for purposes other than remedying health state, unspecified: Secondary | ICD-10-CM | POA: Diagnosis not present

## 2024-05-25 DIAGNOSIS — Z419 Encounter for procedure for purposes other than remedying health state, unspecified: Secondary | ICD-10-CM | POA: Diagnosis not present

## 2024-06-24 DIAGNOSIS — Z419 Encounter for procedure for purposes other than remedying health state, unspecified: Secondary | ICD-10-CM | POA: Diagnosis not present

## 2024-07-25 DIAGNOSIS — Z419 Encounter for procedure for purposes other than remedying health state, unspecified: Secondary | ICD-10-CM | POA: Diagnosis not present

## 2024-08-10 DIAGNOSIS — M25522 Pain in left elbow: Secondary | ICD-10-CM | POA: Diagnosis not present

## 2024-08-10 DIAGNOSIS — E538 Deficiency of other specified B group vitamins: Secondary | ICD-10-CM | POA: Diagnosis not present

## 2024-08-10 DIAGNOSIS — R7303 Prediabetes: Secondary | ICD-10-CM | POA: Diagnosis not present

## 2024-08-10 DIAGNOSIS — M2550 Pain in unspecified joint: Secondary | ICD-10-CM | POA: Diagnosis not present

## 2024-08-10 DIAGNOSIS — N920 Excessive and frequent menstruation with regular cycle: Secondary | ICD-10-CM | POA: Diagnosis not present

## 2024-08-10 DIAGNOSIS — F5104 Psychophysiologic insomnia: Secondary | ICD-10-CM | POA: Diagnosis not present

## 2024-08-10 DIAGNOSIS — R053 Chronic cough: Secondary | ICD-10-CM | POA: Diagnosis not present

## 2024-08-10 DIAGNOSIS — L659 Nonscarring hair loss, unspecified: Secondary | ICD-10-CM | POA: Diagnosis not present

## 2024-08-10 DIAGNOSIS — R5383 Other fatigue: Secondary | ICD-10-CM | POA: Diagnosis not present

## 2024-08-10 DIAGNOSIS — M25521 Pain in right elbow: Secondary | ICD-10-CM | POA: Diagnosis not present

## 2024-08-25 DIAGNOSIS — Z419 Encounter for procedure for purposes other than remedying health state, unspecified: Secondary | ICD-10-CM | POA: Diagnosis not present

## 2024-12-08 DIAGNOSIS — B351 Tinea unguium: Secondary | ICD-10-CM | POA: Diagnosis not present

## 2025-01-03 ENCOUNTER — Other Ambulatory Visit: Payer: Self-pay | Admitting: Family Medicine

## 2025-01-03 DIAGNOSIS — N63 Unspecified lump in unspecified breast: Secondary | ICD-10-CM

## 2025-01-23 ENCOUNTER — Other Ambulatory Visit

## 2025-01-23 ENCOUNTER — Encounter
